# Patient Record
Sex: Female | Born: 1990 | Race: Black or African American | Hispanic: No | Marital: Single | Smoking: Never smoker
Health system: Southern US, Community
[De-identification: ages and names within clinical notes are randomized; demographics above are authoritative.]

## PROBLEM LIST (undated history)

## (undated) DIAGNOSIS — D649 Anemia, unspecified: Secondary | ICD-10-CM

## (undated) DIAGNOSIS — B977 Papillomavirus as the cause of diseases classified elsewhere: Secondary | ICD-10-CM

## (undated) DIAGNOSIS — D069 Carcinoma in situ of cervix, unspecified: Secondary | ICD-10-CM

## (undated) DIAGNOSIS — I10 Essential (primary) hypertension: Secondary | ICD-10-CM

## (undated) HISTORY — DX: Papillomavirus as the cause of diseases classified elsewhere: B97.7

## (undated) HISTORY — PX: NO PAST SURGERIES: SHX2092

---
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
## 2014-06-01 ENCOUNTER — Encounter (HOSPITAL_COMMUNITY): Payer: Self-pay | Admitting: Emergency Medicine

## 2014-06-01 ENCOUNTER — Emergency Department (HOSPITAL_COMMUNITY)
Admission: EM | Admit: 2014-06-01 | Discharge: 2014-06-01 | Disposition: A | Discharge status: Home / Self Care | Payer: Medicaid Other | Source: Home / Self Care | Attending: Family Medicine | Admitting: Family Medicine

## 2014-06-01 DIAGNOSIS — J302 Other seasonal allergic rhinitis: Secondary | ICD-10-CM | POA: Diagnosis not present

## 2014-06-01 MED ORDER — FLUTICASONE PROPIONATE 50 MCG/ACT NA SUSP: 1.0000 | Freq: Two times a day (BID) | NASAL | Status: DC

## 2014-06-01 MED ORDER — METHYLPREDNISOLONE ACETATE 80 MG/ML IJ SUSP
80.0000 mg | Freq: Once | INTRAMUSCULAR | Status: AC
  Administered 2014-06-01: 80 mg via INTRAMUSCULAR

## 2014-06-01 MED ORDER — METHYLPREDNISOLONE ACETATE 80 MG/ML IJ SUSP
INTRAMUSCULAR | Status: AC
  Filled 2014-06-01: qty 1

## 2014-06-01 MED ORDER — CETIRIZINE HCL 10 MG PO TABS: 10.0000 mg | ORAL_TABLET | Freq: Every day | ORAL | Status: DC

## 2014-06-01 NOTE — ED Provider Notes (Signed)
CSN: [id number]     Arrival date & time 06/01/14  1615 History   First MD Initiated Contact with Patient 06/01/14 1750     Chief Complaint  Patient presents with  . Cough   (Consider location/radiation/quality/duration/timing/severity/associated sxs/prior Treatment) Patient is a 24 y.o. female presenting with cough.  Cough Cough characteristics:  Non-productive, dry and harsh Severity:  Moderate Onset quality:  Gradual Duration:  2 weeks Progression:  Unchanged Chronicity:  New Smoker: no   Context: exposure to allergens, upper respiratory infection and weather changes   Relieved by:  None tried Worsened by:  Nothing tried Ineffective treatments:  None tried Associated symptoms: rhinorrhea and sinus congestion   Associated symptoms: no chills, no fever, no rash, no sore throat and no wheezing     History reviewed. No pertinent past medical history. History reviewed. No pertinent past surgical history. History reviewed. No pertinent family history. History  Substance Use Topics  . Smoking status: Never Smoker   . Smokeless tobacco: Never Used  . Alcohol Use: Yes     Comment: occasional   OB History    No data available     Review of Systems  Constitutional: Negative for fever and chills.  HENT: Positive for congestion, postnasal drip and rhinorrhea. Negative for sore throat.   Respiratory: Positive for cough. Negative for wheezing.   Skin: Negative for rash.    Allergies  Review of patient's allergies indicates not on file.  Home Medications   Prior to Admission medications   Medication Sig Start Date End Date Taking? Authorizing Provider  cetirizine (ZYRTEC) 10 MG tablet Take 1 tablet (10 mg total) by mouth daily. One tab daily for allergies 06/01/14   Linna HoffJames D Kindl, MD  fluticasone [facility](FLONASE) 50 MCG/ACT nasal spray Place 1 spray into both nostrils 2 (two) times daily. 06/01/14   Linna HoffJames D Kindl, MD   Pulse 101  Temp(Src) 98.2 F (36.8 C) (Oral)  Resp 20  SpO2 100%   LMP 05/19/2014 (Exact Date) Physical Exam  Constitutional: She is oriented to person, place, and time. She appears well-developed and well-nourished.  HENT:  Right Ear: External ear normal.  Left Ear: External ear normal.  Mouth/Throat: Oropharynx is clear and moist.  Neck: Normal range of motion. Neck supple.  Cardiovascular: Regular rhythm and normal heart sounds.   Pulmonary/Chest: Effort normal and breath sounds normal.  Lymphadenopathy:    She has no cervical adenopathy.  Neurological: She is alert and oriented to person, place, and time.  Skin: Skin is warm and dry.  Nursing note and vitals reviewed.   ED Course  Procedures (including critical care time) Labs Review Labs Reviewed - No data to display  Imaging Review No results found.   MDM   1. Seasonal allergic rhinitis        Linna HoffJames D Kindl, MD 06/03/14 1428

## 2014-06-01 NOTE — ED Notes (Signed)
Pt has been suffering from a cough for 1 week.  Her son's pediatrician suggested she be seen.

## 2016-02-27 NOTE — L&D Delivery Note (Signed)
26 y.o. G2P1001 at 106w1d delivered a viable female infant in cephalic, LOA position. no nuchal cord, anterior shoulder delivered with ease. 60 sec delayed cord clamping. Cord clamped x2 and cut. Placenta delivered spontaneously intact, with 3VC. Fundus firm on exam with massage and pitocin. Good hemostasis noted.  Laceration: 1st degree perineal  Suture: none Good hemostasis noted.  Mom and baby recovering in LDR.    Apgars: 8/9 Weight: pending   Renne Musca, MD PGY-2 11/29/2016, 12:25 AM  Patient is a G2P1001 at [redacted]w[redacted]d who was admitted with IOL for cHTN, significant hx of morbid obesity but otherwise uncomplicated prenatal course.  She progressed with augmentation via foley, cytotec, Pit/AROM.  I was gloved and present for delivery in its entirety.  Second stage of labor progressed, baby delivered after a couple contractions.  no decels during second stage noted.  Complications: none  Lacerations: none  EBL: 100cc  SHAW, KIMBERLY, CNM 1:44 AM 11/29/2016

## 2016-03-29 ENCOUNTER — Ambulatory Visit (INDEPENDENT_AMBULATORY_CARE_PROVIDER_SITE_OTHER): Payer: Medicaid Other | Admitting: General Practice

## 2016-03-29 ENCOUNTER — Encounter: Payer: Self-pay | Admitting: General Practice

## 2016-03-29 ENCOUNTER — Inpatient Hospital Stay (HOSPITAL_COMMUNITY)
Admission: AD | Admit: 2016-03-29 | Discharge: 2016-03-29 | Disposition: A | Discharge status: Home / Self Care | Payer: Medicaid Other | Source: Ambulatory Visit | Attending: Obstetrics and Gynecology | Admitting: Obstetrics and Gynecology

## 2016-03-29 DIAGNOSIS — N926 Irregular menstruation, unspecified: Secondary | ICD-10-CM | POA: Diagnosis not present

## 2016-03-29 DIAGNOSIS — Z3201 Encounter for pregnancy test, result positive: Secondary | ICD-10-CM

## 2016-03-29 DIAGNOSIS — N912 Amenorrhea, unspecified: Secondary | ICD-10-CM | POA: Diagnosis present

## 2016-03-29 LAB — POCT PREGNANCY, URINE: Preg Test, Ur: POSITIVE — AB

## 2016-03-29 NOTE — MAU Note (Signed)
Pt just wants confirmation of pregnancy. Denies nay pain bleeding or cramping. Had positive HPT.

## 2016-03-29 NOTE — Progress Notes (Signed)
Patient here for pregnancy test today. UPT +. Patient reports first positive home test 03/26/16. LMP 02/29/16 EDD 12/05/16 2238w1d. Informed patient. Patient states she is not taking any medicines or vitamins. Encouraged patient to begin PNV and start care in 5-6 weeks. Pregnancy verification letter given. Patient had no questions

## 2016-03-29 NOTE — MAU Provider Note (Signed)
Ms.Kingston Dorris Carnes Broadus JohnWarren is a 26 y.o. No obstetric history on file. at Unknown who presents to MAU/Clinic today for pregnancy verification. The patient denies abdominal pain or vaginal bleeding today.   BP (!) 113/48   Pulse 80   Temp 98.9 F (37.2 C)   Resp 18   Wt (!) 329 lb (149.2 kg)   LMP 02/29/2016   CONSTITUTIONAL: Well-developed, well-nourished female in no acute distress.  CARDIOVASCULAR: Regular heart rate RESPIRATORY: Normal effort NEUROLOGICAL: Alert and oriented to person, place, and time.  SKIN: Skin is warm and dry. No rash noted. Not diaphoretic. No erythema. No pallor. PSYCH: Normal mood and affect. Normal behavior. Normal judgment and thought content.  MDM Medical Screen Exam Complete  A: Amenorrhea   P: Discharge from MAU Patient advised to follow-up with [facility] for pregnancy confirmation  Monday-Thursday 8am-4pm or Friday 8am-11am Patient may return to MAU as needed or if her condition were to change or worsen   Aviva SignsMarie L Addilynn Mowrer, CNM  03/29/2016 12:45 PM

## 2016-05-13 ENCOUNTER — Encounter (HOSPITAL_COMMUNITY): Payer: Self-pay | Admitting: Emergency Medicine

## 2016-05-13 ENCOUNTER — Ambulatory Visit (HOSPITAL_COMMUNITY)
Admission: EM | Admit: 2016-05-13 | Discharge: 2016-05-13 | Disposition: A | Discharge status: Home / Self Care | Payer: Medicaid Other

## 2016-05-13 DIAGNOSIS — L03114 Cellulitis of left upper limb: Secondary | ICD-10-CM | POA: Diagnosis not present

## 2016-05-13 MED ORDER — CEPHALEXIN 500 MG PO CAPS: 500.0000 mg | ORAL_CAPSULE | Freq: Four times a day (QID) | ORAL | 0 refills | Status: AC

## 2016-05-13 NOTE — Discharge Instructions (Signed)
[facility] worsens and is outside drawn lines or does not improve in 2 days.

## 2016-05-13 NOTE — ED Provider Notes (Signed)
CSN: [id number]     Arrival date & time 05/13/16  1312 History   None    Chief Complaint  Patient presents with  . Extremity Pain   (Consider location/radiation/quality/duration/timing/severity/associated sxs/prior Treatment) 26 y.o. female presents withrash with edema and erythremia to the medial aspect of her left arm  X  1 week. Patient denies any injury or trauma to eht affected area. Evaluation is limited due to the patient's body habitus and limitation of patient to move her affected extremity Condition is acute in nature. Area is not circumventional. Condition is made better by nothing. Condition is made worse by nothing. Patient denies any tratement prior to there arrival at this facility. Patient is approximately [redacted] weeks pregnant.        History reviewed. No pertinent past medical history. History reviewed. No pertinent surgical history. History reviewed. No pertinent family history. Social History  Substance Use Topics  . Smoking status: Never Smoker  . Smokeless tobacco: Never Used  . Alcohol use Yes     Comment: occasional   OB History    Gravida Para Term Preterm AB Living   1             SAB TAB Ectopic Multiple Live Births                 Review of Systems  Constitutional: Negative for chills and fever.  HENT: Negative for ear pain and sore throat.   Eyes: Negative for pain and visual disturbance.  Respiratory: Negative for cough and shortness of breath.   Cardiovascular: Negative for chest pain and palpitations.  Gastrointestinal: Negative for abdominal pain and vomiting.  Genitourinary: Negative for dysuria and hematuria.  Musculoskeletal: Negative for arthralgias and back pain.  Skin: Positive for rash. Negative for color change.  Neurological: Negative for seizures and syncope.  All other systems reviewed and are negative.   Allergies  Patient has no known allergies.  Home Medications   Prior to Admission medications   Medication Sig Start Date  End Date Taking? Authorizing Provider  Prenatal MV-Min-Fe Fum-FA-DHA (PRENATAL 1 PO) Take by mouth.   Yes Historical Provider, MD  cephALEXin (KEFLEX) 500 MG capsule Take 1 capsule (500 mg total) by mouth 4 (four) times daily. 05/13/16 05/23/16  Alene MiresJennifer C Leveta Wahab, NP   Meds Ordered and Administered this Visit  Medications - No data to display  BP 114/81 (BP Location: Right Wrist)   Pulse (!) 130   Temp 98.8 F (37.1 C) (Oral)   Resp 20   LMP 02/29/2016   SpO2 100%  No data found.   Physical Exam  Constitutional: She is oriented to person, place, and time. She appears well-developed and well-nourished.  HENT:  Head: Normocephalic and atraumatic.  Eyes: Conjunctivae are normal.  Neck: Normal range of motion.  Pulmonary/Chest: Effort normal.  Neurological: She is alert and oriented to person, place, and time.  Skin: Skin is warm. There is erythema ( and edema to medial aspect of left upper arm. ).  Psychiatric: She has a normal mood and affect.  Nursing note and vitals reviewed.   Urgent Care Course     Procedures (including critical care time)  Labs Review Labs Reviewed - No data to display  Imaging Review No results found.   Skin site marked for determination of worsening condition  MDM   1. Cellulitis of left upper arm        Alene MiresJennifer C Gustavo Dispenza, NP 05/13/16 1426

## 2016-05-13 NOTE — ED Triage Notes (Signed)
[facility] a [facility] upper left arm pain x 1 week. The patient denied any known injury.

## 2016-05-30 ENCOUNTER — Encounter: Payer: Medicaid Other | Admitting: Obstetrics & Gynecology

## 2016-06-04 ENCOUNTER — Other Ambulatory Visit (HOSPITAL_COMMUNITY)
Admission: RE | Admit: 2016-06-04 | Discharge: 2016-06-04 | Disposition: A | Discharge status: Home / Self Care | Payer: Medicaid Other | Source: Ambulatory Visit | Attending: Certified Nurse Midwife | Admitting: Certified Nurse Midwife

## 2016-06-04 ENCOUNTER — Encounter: Payer: Self-pay | Admitting: Certified Nurse Midwife

## 2016-06-04 ENCOUNTER — Telehealth: Payer: Self-pay | Admitting: *Deleted

## 2016-06-04 ENCOUNTER — Ambulatory Visit (INDEPENDENT_AMBULATORY_CARE_PROVIDER_SITE_OTHER): Payer: Medicaid Other | Admitting: Certified Nurse Midwife

## 2016-06-04 VITALS — BP 160/83 | HR 96 | Ht 69.0 in | Wt 315.4 lb

## 2016-06-04 DIAGNOSIS — Z3481 Encounter for supervision of other normal pregnancy, first trimester: Secondary | ICD-10-CM | POA: Diagnosis not present

## 2016-06-04 DIAGNOSIS — O10919 Unspecified pre-existing hypertension complicating pregnancy, unspecified trimester: Secondary | ICD-10-CM

## 2016-06-04 DIAGNOSIS — O0990 Supervision of high risk pregnancy, unspecified, unspecified trimester: Secondary | ICD-10-CM

## 2016-06-04 DIAGNOSIS — O99210 Obesity complicating pregnancy, unspecified trimester: Secondary | ICD-10-CM

## 2016-06-04 DIAGNOSIS — O10911 Unspecified pre-existing hypertension complicating pregnancy, first trimester: Secondary | ICD-10-CM

## 2016-06-04 DIAGNOSIS — O99211 Obesity complicating pregnancy, first trimester: Secondary | ICD-10-CM | POA: Diagnosis not present

## 2016-06-04 DIAGNOSIS — O26892 Other specified pregnancy related conditions, second trimester: Secondary | ICD-10-CM

## 2016-06-04 DIAGNOSIS — R51 Headache: Secondary | ICD-10-CM

## 2016-06-04 DIAGNOSIS — O219 Vomiting of pregnancy, unspecified: Secondary | ICD-10-CM

## 2016-06-04 MED ORDER — LABETALOL HCL 200 MG PO TABS: 200.0000 mg | ORAL_TABLET | Freq: Two times a day (BID) | ORAL | 6 refills | Status: DC

## 2016-06-04 MED ORDER — DOXYLAMINE-PYRIDOXINE 10-10 MG PO TBEC: DELAYED_RELEASE_TABLET | ORAL | 4 refills | Status: DC

## 2016-06-04 MED ORDER — ASPIRIN 81 MG PO CHEW: 81.0000 mg | CHEWABLE_TABLET | Freq: Every day | ORAL | 12 refills | Status: DC

## 2016-06-04 MED ORDER — BUTALBITAL-APAP-CAFFEINE 50-325-40 MG PO TABS: 1.0000 | ORAL_TABLET | Freq: Four times a day (QID) | ORAL | 4 refills | Status: DC | PRN

## 2016-06-04 MED ORDER — PRENATE PIXIE 10-0.6-0.4-200 MG PO CAPS: 1.0000 | ORAL_CAPSULE | Freq: Every day | ORAL | 12 refills | Status: DC

## 2016-06-04 NOTE — Progress Notes (Signed)
Subjective:    Carly Hopkins is being seen today for her first obstetrical visit.  This is a planned pregnancy. She is at [redacted]w[redacted]d gestation. Her obstetrical history is significant for obesity and hypertension. Relationship with FOB: significant other, living together. Patient does not intend to breast feed. Pregnancy history fully reviewed.  The information documented in the HPI was reviewed and verified.  Menstrual History: OB History    Gravida Para Term Preterm AB Living   SAB TAB Ectopic Multiple Live Births           1       Patient's last menstrual period was 02/29/2016 (exact date).    History reviewed. No pertinent past medical history.  History reviewed. No pertinent surgical history.   (Not in a hospital admission) No Known Allergies  Social History  Substance Use Topics  . Smoking status: Never Smoker  . Smokeless tobacco: Never Used  . Alcohol use No     Comment: occasional    Family History  Problem Relation Age of Onset  . Hypertension Mother   . Diabetes Mother   . Diabetes Maternal Grandmother   . Hypertension Maternal Grandmother   . Hypertension Maternal Grandfather   . Diabetes Maternal Grandfather      Review of Systems Constitutional: negative for weight loss Gastrointestinal: negative for vomiting, + nausea Genitourinary:negative for genital lesions and vaginal discharge and dysuria Musculoskeletal:negative for back pain Behavioral/Psych: negative for abusive relationship, depression, illegal drug usage and tobacco use    Objective:    BP (!) 160/83   Pulse 96   Ht  (1.753 m)   Wt (!) 315 lb 6.4 oz (143.1 kg)   LMP 02/29/2016 (Exact Date)   BMI 46.58 kg/m  General Appearance:    Alert, cooperative, no distress, appears stated age  Head:    Normocephalic, without obvious abnormality, atraumatic  Eyes:    PERRL, conjunctiva/corneas clear, EOM's intact, fundi    benign, both eyes  Ears:    Normal TM's and external ear  canals, both ears  Nose:   Nares normal, septum midline, mucosa normal, no drainage    or sinus tenderness  Throat:   Lips, mucosa, and tongue normal; teeth and gums normal  Neck:   Supple, symmetrical, trachea midline, no adenopathy;    thyroid:  no enlargement/tenderness/nodules; no carotid   bruit or JVD  Back:     Symmetric, no curvature, ROM normal, no CVA tenderness  Lungs:     Clear to auscultation bilaterally, respirations unlabored  Chest Wall:    No tenderness or deformity   Heart:    Regular rate and rhythm, S1 and S2 normal, no murmur, rub   or gallop  Breast Exam:    No tenderness, masses, or nipple abnormality  Abdomen:     Soft, non-tender, bowel sounds active all four quadrants,    no masses, no organomegaly  Genitalia:    Normal female without lesion, discharge or tenderness  Extremities:   Extremities normal, atraumatic, no cyanosis or edema  Pulses:   2+ and symmetric all extremities  Skin:   Skin color, texture, turgor normal, no rashes or lesions  Lymph nodes:   Cervical, supraclavicular, and axillary nodes normal  Neurologic:   CNII-XII intact, normal strength, sensation and reflexes    throughout        Cervix: friable on exam, long, thick, closed and posterior.  FHR: 155 by doppler.  Size c/w dates.      Lab Review Urine pregnancy test Labs reviewed yes Radiologic studies reviewed no Assessment:    Pregnancy at [redacted]w[redacted]d weeks   Supervision of high risk pregnancy, antepartum - Plan: Cytology - PAP, Cervicovaginal ancillary only, Hemoglobinopathy evaluation, Varicella zoster antibody, IgG, Culture, OB Urine, Hemoglobin A1c, Obstetric Panel, Including HIV, Cystic Fibrosis Mutation 97, MaterniT Genome, TSH, Lactate dehydrogenase, Creatinine, serum, ALT, AST, Pathologist smear review, Protein / creatinine ratio, urine, AMB referral to maternal fetal medicine, [place] MFM OB DETAIL +14 WK, Prenat-FeAsp-Meth-FA-DHA w/o A (PRENATE PIXIE) 10-0.6-0.4-200 MG CAPS  Maternal  morbid obesity, antepartum (HCC)  Chronic hypertension affecting pregnancy - Plan: labetalol (NORMODYNE) 200 MG tablet, aspirin 81 MG chewable tablet  Nausea and vomiting during pregnancy prior to [redacted] weeks gestation - Plan: Doxylamine-Pyridoxine (DICLEGIS) 10-10 MG TBEC  Headache in pregnancy, antepartum, second trimester - Plan: butalbital-acetaminophen-caffeine (FIORICET, ESGIC) 50-325-40 MG tablet   Plan:      Prenatal vitamins.  Counseling provided regarding continued use of seat belts, cessation of alcohol consumption, smoking or use of illicit drugs; infection precautions i.e., influenza/TDAP immunizations, toxoplasmosis,CMV, parvovirus, listeria and varicella; workplace safety, exercise during pregnancy; routine dental care, safe medications, sexual activity, hot tubs, saunas, pools, travel, caffeine use, fish and methlymercury, potential toxins, hair treatments, varicose veins Weight gain recommendations per IOM guidelines reviewed: underweight/BMI< 18.5--> gain 28 - 40 lbs; normal weight/BMI 18.5 - 24.9--> gain 25 - 35 lbs; overweight/BMI 25 - 29.9--> gain 15 - 25 lbs; obese/BMI >30->gain  11 - 20 lbs Problem list reviewed and updated. FIRST/CF mutation testing/NIPT/QUAD SCREEN/fragile X/Ashkenazi Jewish population testing/Spinal muscular atrophy discussed: ordered. Role of ultrasound in pregnancy discussed; fetal survey: ordered. Amniocentesis discussed: not indicated.  Meds ordered this encounter  Medications  . labetalol (NORMODYNE) 200 MG tablet    Sig: Take 1 tablet (200 mg total) by mouth 2 (two) times daily.    Dispense:  60 tablet    Refill:  6  . aspirin 81 MG chewable tablet    Sig: Chew 1 tablet (81 mg total) by mouth daily.    Dispense:  30 tablet    Refill:  12   Orders Placed This Encounter  Procedures  . Culture, OB Urine  . [place] MFM OB DETAIL +14 WK    Standing Status:   Future    Standing Expiration Date:   08/04/2017    Order Specific Question:   Reason  for Exam (SYMPTOM  OR DIAGNOSIS REQUIRED)    Answer:   fetal anatomy scan    Order Specific Question:   Preferred imaging location?    Answer:   [facility]  . Hemoglobinopathy evaluation  . Varicella zoster antibody, IgG  . Hemoglobin A1c  . Obstetric Panel, Including HIV  . Cystic Fibrosis Mutation 97  . MaterniT Genome    Order Specific Question:   Is the patient insulin dependent?    Answer:   No    Order Specific Question:   Please enter gestational age. This should be expressed as weeks AND days, i.e. 16w 6d. Enter weeks here. Enter days in next question.    Answer:   62    Order Specific Question:   Please enter gestational age. This should be expressed as weeks AND days, i.e. 16w 6d. Enter days here. Enter weeks in previous question.    Answer:   5    Order Specific Question:   How was gestational age calculated?    Answer:   LMP  Order Specific Question:   Please give the date of LMP OR Ultrasound OR Estimated date of delivery.    Answer:   12/05/2016    Order Specific Question:   Number of Fetuses (Type of Pregnancy):    Answer:   1    Order Specific Question:   Indications for performing the test? (please choose all that apply):    Answer:   Routine screening    Order Specific Question:   Other Indications? (Y=Yes, N=No)    Answer:   Y    Order Specific Question:   Please specify other indications, if any:    Answer:   chronic hypertension    Order Specific Question:   If this is a repeat specimen, please indicate the reason:    Answer:   Not indicated    Order Specific Question:   Please specify the patient's race: (C=White/Caucasion, B=Black, I=Native American, A=Asian, H=Hispanic, O=Other, U=Unknown)    Answer:   B    Order Specific Question:   Donor Egg - indicate if the egg was obtained from in vitro fertilization.    Answer:   N    Order Specific Question:   Age of Egg Donor.    Answer:   14    Order Specific Question:   Prior Down Syndrome/ONTD screening  during current pregnancy.    Answer:   N    Order Specific Question:   Prior First Trimester Testing    Answer:   N    Order Specific Question:   Prior Second Trimester Testing    Answer:   N    Order Specific Question:   Family History of Neural Tube Defects    Answer:   N    Order Specific Question:   Prior Pregnancy with Down Syndrome    Answer:   N    Order Specific Question:   Please give the patient's weight (in pounds)    Answer:   315  . TSH  . Lactate dehydrogenase  . Creatinine, serum  . ALT  . AST  . Pathologist smear review  . Protein / creatinine ratio, urine  . AMB referral to maternal fetal medicine    Referral Priority:   Routine    Referral Type:   Consultation    Referral Reason:   Specialty Services Required    Number of Visits Requested:   1    Follow up in 4 weeks. 50% of 40 min visit spent on counseling and coordination of care.

## 2016-06-04 NOTE — Telephone Encounter (Signed)
PA for Diclegis received from pharmacy. PA was submitted and approved. Pharmacy notified.

## 2016-06-04 NOTE — Progress Notes (Signed)
Patient reports no concerns today- she has been having headache for 2 days. No vision changes or dizziness

## 2016-06-05 LAB — PROTEIN / CREATININE RATIO, URINE
Creatinine, Urine: 153.3 mg/dL
Protein, Ur: 18.4 mg/dL
Protein/Creat Ratio: 120 mg/g creat (ref 0–200)

## 2016-06-05 LAB — CERVICOVAGINAL ANCILLARY ONLY
Bacterial vaginitis: POSITIVE — AB
CHLAMYDIA, DNA PROBE: NEGATIVE
Candida vaginitis: NEGATIVE
NEISSERIA GONORRHEA: NEGATIVE
TRICH (WINDOWPATH): NEGATIVE

## 2016-06-06 ENCOUNTER — Other Ambulatory Visit: Payer: Self-pay | Admitting: Certified Nurse Midwife

## 2016-06-06 DIAGNOSIS — B9689 Other specified bacterial agents as the cause of diseases classified elsewhere: Secondary | ICD-10-CM

## 2016-06-06 DIAGNOSIS — N760 Acute vaginitis: Principal | ICD-10-CM

## 2016-06-06 LAB — OBSTETRIC PANEL, INCLUDING HIV
Antibody Screen: NEGATIVE
Basophils Absolute: 0 10*3/uL (ref 0.0–0.2)
Basos: 0 %
EOS (ABSOLUTE): 0.1 10*3/uL (ref 0.0–0.4)
Eos: 1 %
HIV Screen 4th Generation wRfx: NONREACTIVE
Hematocrit: 31.3 % — ABNORMAL LOW (ref 34.0–46.6)
Hemoglobin: 10.3 g/dL — ABNORMAL LOW (ref 11.1–15.9)
Hepatitis B Surface Ag: NEGATIVE
IMMATURE GRANS (ABS): 0 10*3/uL (ref 0.0–0.1)
IMMATURE GRANULOCYTES: 0 %
LYMPHS: 28 %
Lymphocytes Absolute: 2.3 10*3/uL (ref 0.7–3.1)
MCH: 26.7 pg (ref 26.6–33.0)
MCHC: 32.9 g/dL (ref 31.5–35.7)
MCV: 81 fL (ref 79–97)
MONOS ABS: 0.5 10*3/uL (ref 0.1–0.9)
Monocytes: 6 %
NEUTROS PCT: 65 %
Neutrophils Absolute: 5.3 10*3/uL (ref 1.4–7.0)
PLATELETS: 294 10*3/uL (ref 150–379)
RBC: 3.86 x10E6/uL (ref 3.77–5.28)
RDW: 17.3 % — AB (ref 12.3–15.4)
RPR Ser Ql: NONREACTIVE
Rh Factor: POSITIVE
Rubella Antibodies, IGG: 1.18 index (ref >0.99)
WBC: 8.2 10*3/uL (ref 3.4–10.8)

## 2016-06-06 LAB — CREATININE, SERUM
CREATININE: 0.49 mg/dL — AB (ref 0.57–1.00)
GFR calc Af Amer: 157 mL/min/{1.73_m2} (ref >59)
GFR, EST NON AFRICAN AMERICAN: 136 mL/min/{1.73_m2} (ref >59)

## 2016-06-06 LAB — HEMOGLOBINOPATHY EVALUATION
HGB A: 98 % (ref 96.4–98.8)
HGB C: 0 %
HGB S: 0 %
HGB VARIANT: 0 %
Hemoglobin A2 Quantitation: 2 % (ref 1.8–3.2)
Hemoglobin F Quantitation: 0 % (ref 0.0–2.0)

## 2016-06-06 LAB — TSH: TSH: 1.84 u[IU]/mL (ref 0.450–4.500)

## 2016-06-06 LAB — AST: AST: 10 IU/L (ref 0–40)

## 2016-06-06 LAB — CULTURE, OB URINE

## 2016-06-06 LAB — LACTATE DEHYDROGENASE: LDH: 161 IU/L (ref 119–226)

## 2016-06-06 LAB — HEMOGLOBIN A1C
Est. average glucose Bld gHb Est-mCnc: 97 mg/dL
HEMOGLOBIN A1C: 5 % (ref 4.8–5.6)

## 2016-06-06 LAB — URINE CULTURE, OB REFLEX

## 2016-06-06 LAB — ALT: ALT: 10 IU/L (ref 0–32)

## 2016-06-06 LAB — VARICELLA ZOSTER ANTIBODY, IGG: Varicella zoster IgG: 2365 index (ref >165)

## 2016-06-06 MED ORDER — METRONIDAZOLE 0.75 % VA GEL: 1.0000 | Freq: Two times a day (BID) | VAGINAL | 0 refills | Status: DC

## 2016-06-07 ENCOUNTER — Other Ambulatory Visit: Payer: Self-pay | Admitting: Certified Nurse Midwife

## 2016-06-07 DIAGNOSIS — O99012 Anemia complicating pregnancy, second trimester: Secondary | ICD-10-CM

## 2016-06-07 DIAGNOSIS — B977 Papillomavirus as the cause of diseases classified elsewhere: Secondary | ICD-10-CM | POA: Insufficient documentation

## 2016-06-07 DIAGNOSIS — R87613 High grade squamous intraepithelial lesion on cytologic smear of cervix (HGSIL): Secondary | ICD-10-CM

## 2016-06-07 DIAGNOSIS — D069 Carcinoma in situ of cervix, unspecified: Secondary | ICD-10-CM | POA: Insufficient documentation

## 2016-06-07 HISTORY — DX: Papillomavirus as the cause of diseases classified elsewhere: B97.7

## 2016-06-07 LAB — PATHOLOGIST SMEAR REVIEW
BASOS: 0 %
Basophils Absolute: 0 10*3/uL (ref 0.0–0.2)
EOS (ABSOLUTE): 0.1 10*3/uL (ref 0.0–0.4)
Eos: 1 %
HEMOGLOBIN: 10 g/dL — AB (ref 11.1–15.9)
Hematocrit: 32.2 % — ABNORMAL LOW (ref 34.0–46.6)
IMMATURE GRANS (ABS): 0 10*3/uL (ref 0.0–0.1)
Immature Granulocytes: 0 %
LYMPHS: 28 %
Lymphocytes Absolute: 2.3 10*3/uL (ref 0.7–3.1)
MCH: 26.1 pg — ABNORMAL LOW (ref 26.6–33.0)
MCHC: 31.1 g/dL — AB (ref 31.5–35.7)
MCV: 84 fL (ref 79–97)
MONOCYTES: 6 %
Monocytes Absolute: 0.5 10*3/uL (ref 0.1–0.9)
NEUTROS ABS: 5.3 10*3/uL (ref 1.4–7.0)
Neutrophils: 65 %
PATH REV PLTS: NORMAL
PATH REV WBC: NORMAL
Platelets: 296 10*3/uL (ref 150–379)
RBC: 3.83 x10E6/uL (ref 3.77–5.28)
RDW: 17.1 % — AB (ref 12.3–15.4)
WBC: 8.1 10*3/uL (ref 3.4–10.8)

## 2016-06-07 LAB — CYTOLOGY - PAP: HPV: DETECTED — AB

## 2016-06-07 MED ORDER — CITRANATAL BLOOM 90-1 MG PO TABS: 1.0000 | ORAL_TABLET | Freq: Every day | ORAL | 12 refills | Status: DC

## 2016-06-08 LAB — CYSTIC FIBROSIS MUTATION 97: Interpretation: NOT DETECTED

## 2016-06-10 LAB — MATERNIT GENOME

## 2016-06-12 ENCOUNTER — Other Ambulatory Visit: Payer: Self-pay | Admitting: Certified Nurse Midwife

## 2016-06-12 DIAGNOSIS — O0990 Supervision of high risk pregnancy, unspecified, unspecified trimester: Secondary | ICD-10-CM

## 2016-07-02 ENCOUNTER — Other Ambulatory Visit (HOSPITAL_COMMUNITY)
Admission: RE | Admit: 2016-07-02 | Discharge: 2016-07-02 | Disposition: A | Discharge status: Home / Self Care | Payer: Medicaid Other | Source: Ambulatory Visit | Attending: Obstetrics and Gynecology | Admitting: Obstetrics and Gynecology

## 2016-07-02 ENCOUNTER — Ambulatory Visit (INDEPENDENT_AMBULATORY_CARE_PROVIDER_SITE_OTHER): Payer: Medicaid Other | Admitting: Obstetrics and Gynecology

## 2016-07-02 VITALS — BP 124/78 | HR 88 | Wt 311.0 lb

## 2016-07-02 DIAGNOSIS — N871 Moderate cervical dysplasia: Secondary | ICD-10-CM | POA: Insufficient documentation

## 2016-07-02 DIAGNOSIS — O99212 Obesity complicating pregnancy, second trimester: Secondary | ICD-10-CM | POA: Diagnosis not present

## 2016-07-02 DIAGNOSIS — R87613 High grade squamous intraepithelial lesion on cytologic smear of cervix (HGSIL): Secondary | ICD-10-CM

## 2016-07-02 DIAGNOSIS — Z331 Pregnant state, incidental: Secondary | ICD-10-CM

## 2016-07-02 DIAGNOSIS — O10912 Unspecified pre-existing hypertension complicating pregnancy, second trimester: Secondary | ICD-10-CM | POA: Diagnosis not present

## 2016-07-02 DIAGNOSIS — O0992 Supervision of high risk pregnancy, unspecified, second trimester: Secondary | ICD-10-CM

## 2016-07-02 DIAGNOSIS — O0990 Supervision of high risk pregnancy, unspecified, unspecified trimester: Secondary | ICD-10-CM

## 2016-07-02 DIAGNOSIS — O10919 Unspecified pre-existing hypertension complicating pregnancy, unspecified trimester: Secondary | ICD-10-CM | POA: Insufficient documentation

## 2016-07-02 DIAGNOSIS — O99210 Obesity complicating pregnancy, unspecified trimester: Secondary | ICD-10-CM

## 2016-07-02 NOTE — Progress Notes (Signed)
   PRENATAL VISIT NOTE  Subjective:  Carly Hopkins is a 26 y.o. G2P1001 at 7914w5d being seen today for ongoing prenatal care.  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  Patient reports no complaints.  Contractions: Not present. Vag. Bleeding: None.  Movement: Present. Denies leaking of fluid.   The following portions of the patient's history were reviewed and updated as appropriate: allergies, current medications, past family history, past medical history, past social history, past surgical history and problem list. Problem list updated.  Objective:   Vitals:   07/02/16 0836  BP: 124/78  Pulse: 88  Weight: (!) 311 lb (141.1 kg)    Fetal Status:     Movement: Present     General:  Alert, oriented and cooperative. Patient is in no acute distress.  Skin: Skin is warm and dry. No rash noted.   Cardiovascular: Normal heart rate noted  Respiratory: Normal respiratory effort, no problems with respiration noted  Abdomen: Soft, gravid, appropriate for gestational age. Pain/Pressure: Absent     Pelvic:  Cervical exam deferred        Extremities: Normal range of motion.  Edema: None  Mental Status: Normal mood and affect. Normal behavior. Normal judgment and thought content.   Assessment and Plan:  Pregnancy: G2P1001 at 5514w5d  1. Supervision of high risk pregnancy, antepartum Patient is doing well without complaints Follow up anatomy ultrasound scheduled on 5/16  2. 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  Placed in lithotomy position. Cervix viewed with speculum and colposcope after application  of acetic acid.   Colposcopy adequate?  No TZ not visualized Acetowhite lesions? Yes 5-7 o'clock Punctation? yes Mosaicism?  no Abnormal vasculature?  no Biopsies? Yes at 6 o'clock ECC? no  3. Maternal morbid obesity, antepartum (HCC)   4. Chronic hypertension during pregnancy, antepartum Continue ASA and labetalol 200 BID Discussed serial growth ultrasound and plan for IOL at 39 weeks  General obstetric precautions including but not limited to vaginal bleeding, contractions, leaking of fluid and fetal movement were reviewed in detail with the patient. Please refer to After Visit Summary for other counseling recommendations.  No Follow-up on file.   Sokha Craker, Gigi GinPeggy, MD

## 2016-07-09 ENCOUNTER — Telehealth: Payer: Self-pay | Admitting: *Deleted

## 2016-07-09 NOTE — Telephone Encounter (Signed)
Attempt to contact pt regarding results and recommendations. LM on VM to call office.

## 2016-07-09 NOTE — Telephone Encounter (Signed)
-----   Message from Catalina AntiguaPeggy Constant, MD sent at 07/05/2016  1:44 PM EDT ----- Please inform patient of abnormal colpo and need for LEEP procedure postpartum. We can discuss these results with her at her next ob appointment  Thanks  [facility]

## 2016-07-11 ENCOUNTER — Other Ambulatory Visit (HOSPITAL_COMMUNITY): Payer: Self-pay | Admitting: *Deleted

## 2016-07-11 ENCOUNTER — Encounter (HOSPITAL_COMMUNITY): Payer: Self-pay

## 2016-07-11 ENCOUNTER — Ambulatory Visit (HOSPITAL_COMMUNITY)
Admission: RE | Admit: 2016-07-11 | Discharge: 2016-07-11 | Disposition: A | Discharge status: Home / Self Care | Payer: Medicaid Other | Source: Ambulatory Visit | Attending: Certified Nurse Midwife | Admitting: Certified Nurse Midwife

## 2016-07-11 DIAGNOSIS — Z3A19 19 weeks gestation of pregnancy: Secondary | ICD-10-CM | POA: Diagnosis not present

## 2016-07-11 DIAGNOSIS — O10919 Unspecified pre-existing hypertension complicating pregnancy, unspecified trimester: Secondary | ICD-10-CM

## 2016-07-11 DIAGNOSIS — O99212 Obesity complicating pregnancy, second trimester: Secondary | ICD-10-CM | POA: Insufficient documentation

## 2016-07-11 DIAGNOSIS — O10012 Pre-existing essential hypertension complicating pregnancy, second trimester: Secondary | ICD-10-CM | POA: Diagnosis not present

## 2016-07-11 DIAGNOSIS — Z6841 Body Mass Index (BMI) 40.0 and over, adult: Secondary | ICD-10-CM | POA: Diagnosis not present

## 2016-07-11 DIAGNOSIS — Z363 Encounter for antenatal screening for malformations: Secondary | ICD-10-CM | POA: Insufficient documentation

## 2016-07-11 DIAGNOSIS — O0990 Supervision of high risk pregnancy, unspecified, unspecified trimester: Secondary | ICD-10-CM | POA: Insufficient documentation

## 2016-07-11 HISTORY — DX: Essential (primary) hypertension: I10

## 2016-07-11 NOTE — Addendum Note (Signed)
Encounter addended by: Genevie CheshireWaken, Kelsei Defino M, RT on: 07/11/2016 10:49 AM<BR>    Actions taken: Imaging Exam ended

## 2016-07-12 ENCOUNTER — Ambulatory Visit (HOSPITAL_COMMUNITY)
Admission: RE | Admit: 2016-07-12 | Discharge: 2016-07-12 | Disposition: A | Discharge status: Home / Self Care | Payer: Medicaid Other | Source: Ambulatory Visit | Attending: Certified Nurse Midwife | Admitting: Certified Nurse Midwife

## 2016-07-12 ENCOUNTER — Other Ambulatory Visit: Payer: Self-pay | Admitting: Certified Nurse Midwife

## 2016-07-12 ENCOUNTER — Encounter (HOSPITAL_COMMUNITY): Payer: Self-pay

## 2016-07-12 DIAGNOSIS — E669 Obesity, unspecified: Secondary | ICD-10-CM | POA: Diagnosis not present

## 2016-07-12 DIAGNOSIS — Z7982 Long term (current) use of aspirin: Secondary | ICD-10-CM | POA: Diagnosis not present

## 2016-07-12 DIAGNOSIS — O99212 Obesity complicating pregnancy, second trimester: Secondary | ICD-10-CM | POA: Insufficient documentation

## 2016-07-12 DIAGNOSIS — Z3A19 19 weeks gestation of pregnancy: Secondary | ICD-10-CM | POA: Insufficient documentation

## 2016-07-12 DIAGNOSIS — Z79899 Other long term (current) drug therapy: Secondary | ICD-10-CM | POA: Insufficient documentation

## 2016-07-12 DIAGNOSIS — O162 Unspecified maternal hypertension, second trimester: Secondary | ICD-10-CM | POA: Diagnosis present

## 2016-07-12 DIAGNOSIS — O0990 Supervision of high risk pregnancy, unspecified, unspecified trimester: Secondary | ICD-10-CM

## 2016-07-12 NOTE — Consult Note (Signed)
Maternal Fetal Medicine Consultation  Requesting Provider(s): Valentina Lucksachelle Denny, CNM  Reason for consultation: Chronic hypertension, Obesity  HPI: Carly Hopkins is a 26 yo G2P1001, EDD 12/05/2016 who is currently at 19w 1d seen for consultation due to maternal obesity and chronic hypertension.  The patient reports that she was first diagnosed with hypertension during this current pregnancy - had an intake BP of 158/108 and subsequently 160/83.  She was started on Labetalol 200 mg BID - blood pressures currently well-controlled on this regimen.  Her past OB history is remarkable for a previous term SVD without complications.  She is without complaints today.  OB History: OB History    Gravida Para Term Preterm AB Living   2 1 1     1    SAB TAB Ectopic Multiple Live Births           1      PMH:  Past Medical History:  Diagnosis Date  . Hypertension     PSH:  Past Surgical History:  Procedure Laterality Date  . NO PAST SURGERIES     Meds:  Current Outpatient Prescriptions on File Prior to Encounter  Medication Sig Dispense Refill  . aspirin 81 MG chewable tablet Chew 1 tablet (81 mg total) by mouth daily. 30 tablet 12  . butalbital-acetaminophen-caffeine (FIORICET, ESGIC) 50-325-40 MG tablet Take 1-2 tablets by mouth every 6 (six) hours as needed. 45 tablet 4  . labetalol (NORMODYNE) 200 MG tablet Take 1 tablet (200 mg total) by mouth 2 (two) times daily. 60 tablet 6  . Prenat-FeAsp-Meth-FA-DHA w/o A (PRENATE PIXIE) 10-0.6-0.4-200 MG CAPS Take 1 tablet by mouth daily. 30 capsule 12  . Prenatal MV-Min-Fe Fum-FA-DHA (PRENATAL 1 PO) Take by mouth.    . Prenatal-DSS-FeCb-FeGl-FA (CITRANATAL BLOOM) 90-1 MG TABS Take 1 tablet by mouth daily. 30 tablet 12  . Doxylamine-Pyridoxine (DICLEGIS) 10-10 MG TBEC Take 1 tablet with breakfast and lunch.  Take 2 tablets at bedtime. (Patient not taking: Reported on 07/11/2016) 100 tablet 4  . metroNIDAZOLE (METROGEL VAGINAL) 0.75 % vaginal gel Place 1  Applicatorful vaginally 2 (two) times daily. (Patient not taking: Reported on 07/11/2016) 70 g 0   No current facility-administered medications on file prior to encounter.    Allergies: No Known Allergies   FH:  Family History  Problem Relation Age of Onset  . Hypertension Mother   . Diabetes Mother   . Diabetes Maternal Grandmother   . Hypertension Maternal Grandmother   . Hypertension Maternal Grandfather   . Diabetes Maternal Grandfather     Soc:  Social History   Social History  . Marital status: Single    Spouse name: N/A  . Number of children: N/A  . Years of education: N/A   Occupational History  . Not on file.   Social History Main Topics  . Smoking status: Never Smoker  . Smokeless tobacco: Never Used  . Alcohol use No     Comment: occasional  . Drug use: No  . Sexual activity: Yes    Birth control/ protection: None   Other Topics Concern  . Not on file   Social History Narrative  . No narrative on file    Review of Systems: no vaginal bleeding or cramping/contractions, no LOF, no nausea/vomiting. All other systems reviewed and are negative.  PE:   Vitals:   07/12/16 0906  BP: 118/72  Pulse: 97  Wt: 316 lbs  GEN: well-appearing female   A/P: 1) Single IUP at 19w 1d  2) Maternal Obesity - patient has a strong family history of diabetes.  Had an initial HbA1C of 5.0.  May consider early 1-hr OGTT to screen for gestational diabetes.  3) Chronic hypertension - diagnosis made during the first trimester after having several elevated blood pressures (156/108, 160/83).  She is currently well-controlled on her current dose of Labetalol.  Additionally, the patient is currently taking baby aspirin for preeclampsia prevention.  Recommendations: 1) Continue Labetalol at current dose.  May titrate upwards if needed - would try to achieve a target blood pressure of 140/90 if possible. 2) Serial ultrasounds for growth every 4-6 weeks throughout the remainder of  pregnancy 3) If not yet completed, please get baseline preeclampsia labs to include a baseline 24-hr urine protein 4) Antenatal testing beginning at 32 weeks 5) Delivery at 38-39 weeks if otherwise stable   Thank you for the opportunity to be a part of the care of [employer]. Please contact our office if we can be of further assistance.   I spent approximately 30 minutes with this patient with over 50% of time spent in face-to-face counseling.  Alpha Gula, MD Maternal Fetal Medicine

## 2016-07-13 NOTE — Addendum Note (Signed)
Encounter addended by: Reita ChardPaschal, Phillipa Morden R, RN on: 07/13/2016 10:59 AM<BR>    Actions taken: Charge Capture section accepted

## 2016-07-31 ENCOUNTER — Ambulatory Visit (INDEPENDENT_AMBULATORY_CARE_PROVIDER_SITE_OTHER): Payer: Medicaid Other | Admitting: Obstetrics and Gynecology

## 2016-07-31 VITALS — BP 115/78 | HR 106 | Wt 313.8 lb

## 2016-07-31 DIAGNOSIS — O0990 Supervision of high risk pregnancy, unspecified, unspecified trimester: Secondary | ICD-10-CM

## 2016-07-31 DIAGNOSIS — R87613 High grade squamous intraepithelial lesion on cytologic smear of cervix (HGSIL): Secondary | ICD-10-CM

## 2016-07-31 DIAGNOSIS — O0992 Supervision of high risk pregnancy, unspecified, second trimester: Secondary | ICD-10-CM | POA: Diagnosis not present

## 2016-07-31 DIAGNOSIS — O10919 Unspecified pre-existing hypertension complicating pregnancy, unspecified trimester: Secondary | ICD-10-CM

## 2016-07-31 DIAGNOSIS — O10912 Unspecified pre-existing hypertension complicating pregnancy, second trimester: Secondary | ICD-10-CM

## 2016-07-31 NOTE — Progress Notes (Signed)
   PRENATAL VISIT NOTE  Subjective:  Carly Hopkins is a 26 y.o. G2P1001 at 3472w6d being seen today for ongoing prenatal care.  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  Patient reports no complaints.  Contractions: Not present. Vag. Bleeding: None.  Movement: Present. Denies leaking of fluid.   The following portions of the patient's history were reviewed and updated as appropriate: allergies, current medications, past family history, past medical history, past social history, past surgical history and problem list. Problem list updated.  Objective:   Vitals:   07/31/16 0824  BP: 115/78  Pulse: (!) 106  Weight: (!) 313 lb 12.8 oz (142.3 kg)    Fetal Status: Fetal Heart Rate (bpm): 143 Fundal Height: 23 cm Movement: Present     General:  Alert, oriented and cooperative. Patient is in no acute distress.  Skin: Skin is warm and dry. No rash noted.   Cardiovascular: Normal heart rate noted  Respiratory: Normal respiratory effort, no problems with respiration noted  Abdomen: Soft, gravid, appropriate for gestational age. Pain/Pressure: Absent     Pelvic:  Cervical exam deferred        Extremities: Normal range of motion.  Edema: None  Mental Status: Normal mood and affect. Normal behavior. Normal judgment and thought content.   Assessment and Plan:  Pregnancy: G2P1001 at 2272w6d  1. Supervision of high risk pregnancy, antepartum Patient is doing well without complaints followup anatomy scheduled on 6/13 Third trimester labs and glucola next visit  2. 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 Chronic  hypertension during pregnancy, antepartum Continue ASA and labetalol 200 mg BID Follow up growth and anatomy on 6/13 Fetal testing starting at 32 weeks  General obstetric precautions including but not limited to vaginal bleeding, contractions, leaking of fluid and fetal movement were reviewed in detail with the patient. Please refer to After Visit Summary for other counseling recommendations.  Return in about 4 weeks (around 08/28/2016) for ROB, 2 hr glucola next visit.   Catalina AntiguaPeggy Arlie Riker, MD

## 2016-07-31 NOTE — Patient Instructions (Signed)
Cervical Conization °Cervical conization (cone biopsy) is a procedure in which a cone-shaped portion of the cervix is cut out so that it can be examined under a microscope. The procedure is done to check for cancer cells or cells that might turn into cancer (precancerous cells). You may have this procedure if: °· You have abnormal bleeding from your cervix. °· You had an abnormal Pap test. °· Something abnormal was seen on your cervix during an exam. ° °This procedure is performed in either a health care provider’s office or in an operating room. °Tell a health care provider about: °· Any allergies you have. °· All medicines you are taking, including vitamins, herbs, eye drops, creams, and over-the-counter medicines. °· Any problems you or family members have had with the use of anesthetic medicines. °· Any blood disorders you have. °· Any surgeries you have had. °· Any medical conditions you have. °· Your smoking habits. °· When you normally have your period. °· Whether you are pregnant or may be pregnant. °What are the risks? °Generally, this is a safe procedure. However, problems may occur, including: °· Heavy bleeding for several days or weeks after the procedure. °· Allergic reactions to medicines or dyes. °· Increased risk of preterm labor in future pregnancies. °· Infection (rare). °· Damage to the cervix or other structures or organs (rare). ° °What happens before the procedure? °Staying hydrated °Follow instructions from your health care provider about hydration, which may include: °· Up to 2 hours before the procedure - you may continue to drink clear liquids, such as water, clear fruit juice, black coffee, and plain tea. ° °Eating and drinking restrictions °Follow instructions from your health care provider about eating and drinking, which may include: °· 8 hours before the procedure - stop eating heavy meals or foods such as meat, fried foods, or fatty foods. °· 6 hours before the procedure - stop eating  light meals or foods, such as toast or cereal. °· 6 hours before the procedure - stop drinking milk or drinks that contain milk. °· 2 hours before the procedure - stop drinking clear liquids. ° °General instructions °· Do not douche, have sex, use tampons, or use any vaginal medicines before the procedure as told by your health care provider. °· You may be asked to empty your bladder and bowel right before the procedure. °· Ask your health care provider about: °? Changing or stopping your normal medicines. This is important if you take diabetes medicines or blood thinners. °? Taking medicines such as aspirin and ibuprofen. These medicines can thin your blood. Do not take these medicines before your procedure if your doctor tells you not to. °· Plan to have someone take you home from the hospital or clinic. °What happens during the procedure? °· To reduce your risk of infection: °? Your health care team will wash or sanitize their hands. °? Your skin will be washed with soap. °? Hair may be removed from the surgical area. °· You will undress from the waist down and be given a gown to wear. °· You will lie on an examining table and put your feet in stirrups. °· An IV tube will be inserted into one of your veins. °· You will be given one or more of the following: °? A medicine to help you relax (sedative). °? A medicine to numb the area (local anesthetic). °? A medicine to make you fall asleep (general anesthetic). °? A medicine that numbs the cervix (cervical block). °·   A lubricated device called a speculum will be inserted into your vagina. It will be used to spread open the walls of the vagina so your health care provider can see the inside of the vagina and cervix better. °· An instrument that has a magnifying lens and a light (colposcope) will let your health care provider examine the cervix more closely. °· Your health care provider will apply a solution to your cervix. This turns abnormal areas a pale  color. °· A tissue sample will be removed from the cervix using one of the following methods: °? The cold knife method. In this method, the tissue is cut out with a knife (scalpel). °? The loop electrosurgical excision procedure (LEEP) method. In this method, the tissue is cut out with a thin wire that can burn (cauterize) the tissue with an electrical current. °? Laser treatment method. In this method, the tissue is cut out and then cauterized with a laser beam to prevent bleeding. °· Your health care provider will apply a paste over the biopsy areas to help control bleeding. °· The tissue sample will be examined under a microscope. °The procedure may vary among health care providers and hospitals. °What happens after the procedure? °· Your blood pressure, heart rate, breathing rate, and blood oxygen level will be monitored often until the medicines you were given have worn off. °· If you were given a local anesthetic, you will rest at the clinic or hospital until you are stable and feel ready to go home. °· If you were given a general anesthetic, you may be monitored for a longer period of time. °· You may have some cramping. °· You may have bloody discharge or light to moderate bleeding. °· You may have dark discharge coming from your vagina. This is from the paste used on the cervix to prevent bleeding. °Summary °· Cervical conization is a procedure in which a cone-shaped portion of the cervix is cut out so that it can be examined under a microscope. °· The procedure is done to check for cancer cells or cells that might turn into cancer (precancerous cells). °This information is not intended to replace advice given to you by your health care provider. Make sure you discuss any questions you have with your health care provider. °Document Released: 11/22/2004 Document Revised: 02/15/2016 Document Reviewed: 02/15/2016 °Elsevier Interactive Patient Education © 2017 [employer] ° °

## 2016-08-08 ENCOUNTER — Ambulatory Visit (HOSPITAL_COMMUNITY)
Admission: RE | Admit: 2016-08-08 | Discharge: 2016-08-08 | Disposition: A | Discharge status: Home / Self Care | Payer: Medicaid Other | Source: Ambulatory Visit | Attending: Certified Nurse Midwife | Admitting: Certified Nurse Midwife

## 2016-08-08 ENCOUNTER — Encounter (HOSPITAL_COMMUNITY): Payer: Self-pay

## 2016-08-08 ENCOUNTER — Other Ambulatory Visit (HOSPITAL_COMMUNITY): Payer: Self-pay | Admitting: Obstetrics and Gynecology

## 2016-08-08 ENCOUNTER — Other Ambulatory Visit (HOSPITAL_COMMUNITY): Payer: Self-pay | Admitting: *Deleted

## 2016-08-08 DIAGNOSIS — O09892 Supervision of other high risk pregnancies, second trimester: Secondary | ICD-10-CM | POA: Diagnosis not present

## 2016-08-08 DIAGNOSIS — Z3A23 23 weeks gestation of pregnancy: Secondary | ICD-10-CM

## 2016-08-08 DIAGNOSIS — O99212 Obesity complicating pregnancy, second trimester: Secondary | ICD-10-CM | POA: Insufficient documentation

## 2016-08-08 DIAGNOSIS — O10919 Unspecified pre-existing hypertension complicating pregnancy, unspecified trimester: Secondary | ICD-10-CM

## 2016-08-08 DIAGNOSIS — Z362 Encounter for other antenatal screening follow-up: Secondary | ICD-10-CM | POA: Insufficient documentation

## 2016-08-08 DIAGNOSIS — O10012 Pre-existing essential hypertension complicating pregnancy, second trimester: Secondary | ICD-10-CM | POA: Diagnosis present

## 2016-08-27 ENCOUNTER — Ambulatory Visit (INDEPENDENT_AMBULATORY_CARE_PROVIDER_SITE_OTHER): Payer: Medicaid Other | Admitting: Obstetrics and Gynecology

## 2016-08-27 VITALS — BP 100/67 | HR 97 | Wt 309.0 lb

## 2016-08-27 DIAGNOSIS — O0992 Supervision of high risk pregnancy, unspecified, second trimester: Secondary | ICD-10-CM

## 2016-08-27 DIAGNOSIS — O10912 Unspecified pre-existing hypertension complicating pregnancy, second trimester: Secondary | ICD-10-CM | POA: Diagnosis not present

## 2016-08-27 DIAGNOSIS — O0990 Supervision of high risk pregnancy, unspecified, unspecified trimester: Secondary | ICD-10-CM

## 2016-08-27 DIAGNOSIS — R87613 High grade squamous intraepithelial lesion on cytologic smear of cervix (HGSIL): Secondary | ICD-10-CM

## 2016-08-27 DIAGNOSIS — O99212 Obesity complicating pregnancy, second trimester: Secondary | ICD-10-CM

## 2016-08-27 DIAGNOSIS — O10919 Unspecified pre-existing hypertension complicating pregnancy, unspecified trimester: Secondary | ICD-10-CM

## 2016-08-27 DIAGNOSIS — O99210 Obesity complicating pregnancy, unspecified trimester: Secondary | ICD-10-CM

## 2016-08-27 MED ORDER — PRENATE PIXIE 10-0.6-0.4-200 MG PO CAPS: 1.0000 | ORAL_CAPSULE | Freq: Every day | ORAL | 12 refills | Status: DC

## 2016-08-27 MED ORDER — ASPIRIN 81 MG PO CHEW: 81.0000 mg | CHEWABLE_TABLET | Freq: Every day | ORAL | 12 refills | Status: DC

## 2016-08-27 MED ORDER — LABETALOL HCL 200 MG PO TABS: 200.0000 mg | ORAL_TABLET | Freq: Two times a day (BID) | ORAL | 6 refills | Status: DC

## 2016-08-27 NOTE — Progress Notes (Signed)
   PRENATAL VISIT NOTE  Subjective:  Carly Hopkins is a 26 y.o. G2P1001 at 421w5d being seen today for ongoing prenatal care.  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  Patient reports no complaints.  Contractions: Not present. Vag. Bleeding: None.  Movement: Present. Denies leaking of fluid.   The following portions of the patient's history were reviewed and updated as appropriate: allergies, current medications, past family history, past medical history, past social history, past surgical history and problem list. Problem list updated.  Objective:   Vitals:   08/27/16 0851  BP: 100/67  Pulse: 97  Weight: (!) 309 lb (140.2 kg)    Fetal Status: Fetal Heart Rate (bpm): 135 Fundal Height: 27 cm Movement: Present     General:  Alert, oriented and cooperative. Patient is in no acute distress.  Skin: Skin is warm and dry. No rash noted.   Cardiovascular: Normal heart rate noted  Respiratory: Normal respiratory effort, no problems with respiration noted  Abdomen: Soft, gravid, appropriate for gestational age. Pain/Pressure: Present     Pelvic:  Cervical exam deferred        Extremities: Normal range of motion.  Edema: None  Mental Status: Normal mood and affect. Normal behavior. Normal judgment and thought content.   Assessment and Plan:  Pregnancy: G2P1001 at 721w5d  1. Supervision of high risk pregnancy, antepartum Patient is doing well without complaints glucola next visit with labs - Prenat-FeAsp-Meth-FA-DHA w/o A (PRENATE PIXIE) 10-0.6-0.4-200 MG CAPS; Take 1 tablet by mouth daily.  Dispense: 30 capsule; Refill: 12  2. 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  Chronic hypertension during pregnancy, antepartum Continue labetalol and ASA Follow up growth ultrasound on 7/18  4. Maternal morbid obesity, antepartum (HCC)   5. Chronic hypertension affecting pregnancy - labetalol (NORMODYNE) 200 MG tablet; Take 1 tablet (200 mg total) by mouth 2 (two) times daily.  Dispense: 60 tablet; Refill: 6 - aspirin 81 MG chewable tablet; Chew 1 tablet (81 mg total) by mouth daily.  Dispense: 30 tablet; Refill: 12  Preterm labor symptoms and general obstetric precautions including but not limited to vaginal bleeding, contractions, leaking of fluid and fetal movement were reviewed in detail with the patient. Please refer to After Visit Summary for other counseling recommendations.  Return in about 2 weeks (around 09/10/2016) for ROB, 2 hr glucola next visit.   Carly AntiguaPeggy Danna Sewell, MD

## 2016-09-12 ENCOUNTER — Other Ambulatory Visit (HOSPITAL_COMMUNITY): Payer: Self-pay | Admitting: *Deleted

## 2016-09-12 ENCOUNTER — Other Ambulatory Visit (HOSPITAL_COMMUNITY): Payer: Self-pay | Admitting: Obstetrics and Gynecology

## 2016-09-12 ENCOUNTER — Ambulatory Visit (HOSPITAL_COMMUNITY)
Admission: RE | Admit: 2016-09-12 | Discharge: 2016-09-12 | Disposition: A | Discharge status: Home / Self Care | Payer: Medicaid Other | Source: Ambulatory Visit | Attending: Certified Nurse Midwife | Admitting: Certified Nurse Midwife

## 2016-09-12 ENCOUNTER — Encounter (HOSPITAL_COMMUNITY): Payer: Self-pay

## 2016-09-12 DIAGNOSIS — O99213 Obesity complicating pregnancy, third trimester: Secondary | ICD-10-CM | POA: Insufficient documentation

## 2016-09-12 DIAGNOSIS — Z3A28 28 weeks gestation of pregnancy: Secondary | ICD-10-CM

## 2016-09-12 DIAGNOSIS — O10013 Pre-existing essential hypertension complicating pregnancy, third trimester: Secondary | ICD-10-CM | POA: Insufficient documentation

## 2016-09-12 DIAGNOSIS — Z362 Encounter for other antenatal screening follow-up: Secondary | ICD-10-CM | POA: Diagnosis not present

## 2016-09-12 DIAGNOSIS — O10919 Unspecified pre-existing hypertension complicating pregnancy, unspecified trimester: Secondary | ICD-10-CM

## 2016-09-12 DIAGNOSIS — O10913 Unspecified pre-existing hypertension complicating pregnancy, third trimester: Secondary | ICD-10-CM

## 2016-09-13 ENCOUNTER — Ambulatory Visit (INDEPENDENT_AMBULATORY_CARE_PROVIDER_SITE_OTHER): Payer: Medicaid Other | Admitting: Obstetrics & Gynecology

## 2016-09-13 ENCOUNTER — Encounter: Payer: Self-pay | Admitting: Obstetrics & Gynecology

## 2016-09-13 ENCOUNTER — Other Ambulatory Visit: Payer: Medicaid Other

## 2016-09-13 VITALS — BP 136/80 | HR 99 | Wt 315.0 lb

## 2016-09-13 DIAGNOSIS — O0993 Supervision of high risk pregnancy, unspecified, third trimester: Secondary | ICD-10-CM

## 2016-09-13 DIAGNOSIS — O10919 Unspecified pre-existing hypertension complicating pregnancy, unspecified trimester: Secondary | ICD-10-CM

## 2016-09-13 DIAGNOSIS — O10913 Unspecified pre-existing hypertension complicating pregnancy, third trimester: Secondary | ICD-10-CM

## 2016-09-13 DIAGNOSIS — O99213 Obesity complicating pregnancy, third trimester: Secondary | ICD-10-CM

## 2016-09-13 DIAGNOSIS — O99210 Obesity complicating pregnancy, unspecified trimester: Secondary | ICD-10-CM

## 2016-09-13 DIAGNOSIS — O0990 Supervision of high risk pregnancy, unspecified, unspecified trimester: Secondary | ICD-10-CM

## 2016-09-13 NOTE — Progress Notes (Signed)
Pt states she did not take medications this morning due to glucose testing.

## 2016-09-13 NOTE — Progress Notes (Signed)
PRENATAL VISIT NOTE  Subjective:  Carly Hopkins is a 26 y.o. G2P1001 at [redacted]w[redacted]d being seen today for ongoing prenatal care.  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  Patient reports no complaints.  Contractions: Not present. Vag. Bleeding: None.  Movement: Present. Denies leaking of fluid.   The following portions of the patient's history were reviewed and updated as appropriate: allergies, current medications, past family history, past medical history, past social history, past surgical history and problem list. Problem list updated.  Objective:   Vitals:   09/13/16 0834  BP: 136/80  Pulse: 99  Weight: (!) 315 lb (142.9 kg)    Fetal Status: Fetal Heart Rate (bpm): 140   Movement: Present     General:  Alert, oriented and cooperative. Patient is in no acute distress.  Skin: Skin is warm and dry. No rash noted.   Cardiovascular: Normal heart rate noted  Respiratory: Normal respiratory effort, no problems with respiration noted  Abdomen: Soft, gravid, appropriate for gestational age.  Pain/Pressure: Present     Pelvic: Cervical exam deferred        Extremities: Normal range of motion.  Edema: None  Mental Status:  Normal mood and affect. Normal behavior. Normal judgment and thought content.   [place] Mfm Ob Follow Up  Result Date: 09/12/2016 ----------------------------------------------------------------------  OBSTETRICS REPORT                      (Signed Final 09/12/2016 02:08 pm) ---------------------------------------------------------------------- Patient Info  ID #:       [id number]                         D.O.B.:   09-01-1990 (26 yrs)  Name:       [name]               Visit Date:  09/12/2016 11:43 am  ---------------------------------------------------------------------- Performed By  Performed By:     Tommi Emery         Ref. Address:     [address]                                                             [place]  [address]  Attending:        Particia Nearing MD       Location:         [facility]  Referred By:      Roe Coombs CNM ---------------------------------------------------------------------- Orders   #  Description                                 Code   1  [place] MFM OB FOLLOW UP                         [phone]  ----------------------------------------------------------------------   #  Ordered By               Order #        Accession #    Episode #   1  MARK Ezzard Standing              [id number]      [id number]     [id number]  ---------------------------------------------------------------------- Indications   [redacted] weeks gestation of pregnancy                Z3A.28   Hypertension - Chronic/Pre-existing-labetalol  O10.019   and ASA   Maternal morbid obesity (BMI 46.5)             O99.210 E66.01   Encounter for other antenatal screening        Z36.2   follow-up  ---------------------------------------------------------------------- OB History  Blood Type:            Height:  5'9"   Weight (lb):  315      BMI:   46.51  Gravidity:    2         Term:   1  Living:       1 ---------------------------------------------------------------------- Fetal Evaluation  Num Of Fetuses:     1  Fetal Heart         145  Rate(bpm):  Cardiac Activity:   Observed  Presentation:       Cephalic  Placenta:           Posterior, above cervical os  P. Cord Insertion:  Previously Visualized  Amniotic Fluid  AFI FV:      Subjectively within normal limits  AFI Sum(cm)     %Tile       Largest Pocket(cm)  17.1            64          4.98  RUQ(cm)        RLQ(cm)       LUQ(cm)        LLQ(cm)  4.95          4.98          3.14           4.03 ---------------------------------------------------------------------- Biometry  BPD:      72.6  mm     G. Age:  29w 1d         75  %    CI:        81.93   %   70 - 86  FL/HC:      23.0   %   18.8 - 20.6  HC:      253.1  mm     G. Age:  27w 4d         10  %    HC/AC:      1.00       1.05 - 1.21  AC:      252.2  mm     G. Age:  29w 3d         82  %    FL/BPD:     80.2   %   71 - 87  FL:       58.2  mm     G. Age:  30w 3d         92  %    FL/AC:      23.1   %   20 - 24  HUM:      52.5  mm     G. Age:  30w 4d       > 95  %  CER:      35.6  mm     G. Age:  30w 4d         91  %  CM:        6.8  mm  Est. FW:    1408  gm      3 lb 2 oz     78  % ---------------------------------------------------------------------- Gestational Age  LMP:           28w 0d       Date:   02/29/16                 EDD:   12/05/16  U/S Today:     29w 1d                                        EDD:   11/27/16  Best:          28w 0d    Det. By:   LMP  (02/29/16)          EDD:   12/05/16 ---------------------------------------------------------------------- Anatomy  Cranium:               Appears normal         Aortic Arch:            Previously seen  Cavum:                 Appears normal         Ductal Arch:            Not well visualized  Ventricles:            Appears normal         Diaphragm:              Previously seen  Choroid Plexus:        Previously seen        Stomach:                Appears normal, left  sided  Cerebellum:            Appears normal         Abdomen:                Appears normal  Posterior Fossa:       Previously seen        Abdominal Wall:         Appears nml (cord                                                                        insert, abd wall)  Nuchal Fold:           Not applicable (>20    Cord Vessels:            Appears normal ([redacted]                         wks GA)                                        vessel cord)  Face:                  Appears normal         Kidneys:                Appear normal                         (orbits and profile)  Lips:                  Appears normal         Bladder:                Appears normal  Thoracic:              Appears normal         Spine:                  Appears normal  Heart:                 Appears normal         Upper Extremities:      Previously seen                         (4CH, axis, and                         situs)  RVOT:                  Appears normal         Lower Extremities:      Previously seen  LVOT:                  Previously seen  Other:  Technically difficult due to maternal habitus and fetal position. Fetus          appears to be a female previously seen. ---------------------------------------------------------------------- Cervix Uterus Adnexa  Cervix  Not  visualized  Uterus  No abnormality visualized.  Left Ovary  Not visualized. No adnexal mass visualized.  Right Ovary  Not visualized. No adnexal mass visualized. ---------------------------------------------------------------------- Impression  SIUP at 28+0 weeks  Normal interval anatomy; anatomic survey complete except  for the DA  Normal amniotic fluid volume  Appropriate interval growth with EFW at the 78th %tile ---------------------------------------------------------------------- Recommendations  Follow-up ultrasound for growth in 4 weeks ----------------------------------------------------------------------                 Particia Nearing, MD Electronically Signed Final Report   09/12/2016 02:08 pm ----------------------------------------------------------------------   Assessment and Plan:  Pregnancy: G2P1001 at [redacted]w[redacted]d  1. Chronic hypertension during pregnancy, antepartum Had growth scan yesterday, EFW 78%.  Normal BP on Labetalol and ASA.   Will start antenatal testing at 32 weeks; BPP  added on growth scan schedule at that time.  Preeclampsia precautions reviewed. - Comprehensive metabolic panel - Protein / creatinine ratio, urine - [place] MFM FETAL BPP WO NON STRESS; Future  2. Maternal morbid obesity, antepartum (HCC) No weight gain so far this pregnancy. Doing well with diet and exercise.  3. Supervision of high risk pregnancy, antepartum Third trimester labs and Tdap today - CBC - Glucose Tolerance, 2 Hours w/1 Hour - HIV antibody - RPR - Tdap vaccine greater than or equal to 7yo IM Preterm labor symptoms and general obstetric precautions including but not limited to vaginal bleeding, contractions, leaking of fluid and fetal movement were reviewed in detail with the patient. Please refer to After Visit Summary for other counseling recommendations.  Return in about 2 weeks (around 09/27/2016) for OB Visit.   Jaynie Collins, MD

## 2016-09-13 NOTE — Patient Instructions (Signed)
Third Trimester of Pregnancy The third trimester is from week 28 through week 40 (months 7 through 9). The third trimester is a time when the unborn baby (fetus) is growing rapidly. At the end of the ninth month, the fetus is about 20 inches in length and weighs 6-10 pounds. Body changes during your third trimester Your body will continue to go through many changes during pregnancy. The changes vary from woman to woman. During the third trimester:  Your weight will continue to increase. You can expect to gain 25-35 pounds (11-16 kg) by the end of the pregnancy.  You may begin to get stretch marks on your hips, abdomen, and breasts.  You may urinate more often because the fetus is moving lower into your pelvis and pressing on your bladder.  You may develop or continue to have heartburn. This is caused by increased hormones that slow down muscles in the digestive tract.  You may develop or continue to have constipation because increased hormones slow digestion and cause the muscles that push waste through your intestines to relax.  You may develop hemorrhoids. These are swollen veins (varicose veins) in the rectum that can itch or be painful.  You may develop swollen, bulging veins (varicose veins) in your legs.  You may have increased body aches in the pelvis, back, or thighs. This is due to weight gain and increased hormones that are relaxing your joints.  You may have changes in your hair. These can include thickening of your hair, rapid growth, and changes in texture. Some women also have hair loss during or after pregnancy, or hair that feels dry or thin. Your hair will most likely return to normal after your baby is born.  Your breasts will continue to grow and they will continue to become tender. A yellow fluid (colostrum) may leak from your breasts. This is the first milk you are producing for your baby.  Your belly button may stick out.  You may notice more swelling in your hands,  face, or ankles.  You may have increased tingling or numbness in your hands, arms, and legs. The skin on your belly may also feel numb.  You may feel short of breath because of your expanding uterus.  You may have more problems sleeping. This can be caused by the size of your belly, increased need to urinate, and an increase in your body's metabolism.  You may notice the fetus "dropping," or moving lower in your abdomen (lightening).  You may have increased vaginal discharge.  You may notice your joints feel loose and you may have pain around your pelvic bone.  What to expect at prenatal visits You will have prenatal exams every 2 weeks until week 36. Then you will have weekly prenatal exams. During a routine prenatal visit:  You will be weighed to make sure you and the baby are growing normally.  Your blood pressure will be taken.  Your abdomen will be measured to track your baby's growth.  The fetal heartbeat will be listened to.  Any test results from the previous visit will be discussed.  You may have a cervical check near your due date to see if your cervix has softened or thinned (effaced).  You will be tested for Group B streptococcus. This happens between 35 and 37 weeks.  Your health care provider may ask you:  What your birth plan is.  How you are feeling.  If you are feeling the baby move.  If you have had   any abnormal symptoms, such as leaking fluid, bleeding, severe headaches, or abdominal cramping.  If you are using any tobacco products, including cigarettes, chewing tobacco, and electronic cigarettes.  If you have any questions.  Other tests or screenings that may be performed during your third trimester include:  Blood tests that check for low iron levels (anemia).  Fetal testing to check the health, activity level, and growth of the fetus. Testing is done if you have certain medical conditions or if there are problems during the  pregnancy.  Nonstress test (NST). This test checks the health of your baby to make sure there are no signs of problems, such as the baby not getting enough oxygen. During this test, a belt is placed around your belly. The baby is made to move, and its heart rate is monitored during movement.  What is false labor? False labor is a condition in which you feel small, irregular tightenings of the muscles in the womb (contractions) that usually go away with rest, changing position, or drinking water. These are called Braxton Hicks contractions. Contractions may last for hours, days, or even weeks before true labor sets in. If contractions come at regular intervals, become more frequent, increase in intensity, or become painful, you should see your health care provider. What are the signs of labor?  Abdominal cramps.  Regular contractions that start at 10 minutes apart and become stronger and more frequent with time.  Contractions that start on the top of the uterus and spread down to the lower abdomen and back.  Increased pelvic pressure and dull back pain.  A watery or bloody mucus discharge that comes from the vagina.  Leaking of amniotic fluid. This is also known as your "water breaking." It could be a slow trickle or a gush. Let your health care provider know if it has a color or strange odor. If you have any of these signs, call your health care provider right away, even if it is before your due date. Follow these instructions at home: Medicines  Follow your health care provider's instructions regarding medicine use. Specific medicines may be either safe or unsafe to take during pregnancy.  Take a prenatal vitamin that contains at least 600 micrograms (mcg) of folic acid.  If you develop constipation, try taking a stool softener if your health care provider approves. Eating and drinking  Eat a balanced diet that includes fresh fruits and vegetables, whole grains, good sources of protein  such as meat, eggs, or tofu, and low-fat dairy. Your health care provider will help you determine the amount of weight gain that is right for you.  Avoid raw meat and uncooked cheese. These carry germs that can cause birth defects in the baby.  If you have low calcium intake from food, talk to your health care provider about whether you should take a daily calcium supplement.  Eat four or five small meals rather than three large meals a day.  Limit foods that are high in fat and processed sugars, such as fried and sweet foods.  To prevent constipation: ? Drink enough fluid to keep your urine clear or pale yellow. ? Eat foods that are high in fiber, such as fresh fruits and vegetables, whole grains, and beans. Activity  Exercise only as directed by your health care provider. Most women can continue their usual exercise routine during pregnancy. Try to exercise for 30 minutes at least 5 days a week. Stop exercising if you experience uterine contractions.  Avoid heavy   lifting.  Do not exercise in extreme heat or humidity, or at high altitudes.  Wear low-heel, comfortable shoes.  Practice good posture.  You may continue to have sex unless your health care provider tells you otherwise. Relieving pain and discomfort  Take frequent breaks and rest with your legs elevated if you have leg cramps or low back pain.  Take warm sitz baths to soothe any pain or discomfort caused by hemorrhoids. Use hemorrhoid cream if your health care provider approves.  Wear a good support bra to prevent discomfort from breast tenderness.  If you develop varicose veins: ? Wear support pantyhose or compression stockings as told by your healthcare provider. ? Elevate your feet for 15 minutes, 3-4 times a day. Prenatal care  Write down your questions. Take them to your prenatal visits.  Keep all your prenatal visits as told by your health care provider. This is important. Safety  Wear your seat belt at  all times when driving.  Make a list of emergency phone numbers, including numbers for family, friends, the hospital, and police and fire departments. General instructions  Avoid cat litter boxes and soil used by cats. These carry germs that can cause birth defects in the baby. If you have a cat, ask someone to clean the litter box for you.  Do not travel far distances unless it is absolutely necessary and only with the approval of your health care provider.  Do not use hot tubs, steam rooms, or saunas.  Do not drink alcohol.  Do not use any products that contain nicotine or tobacco, such as cigarettes and e-cigarettes. If you need help quitting, ask your health care provider.  Do not use any medicinal herbs or unprescribed drugs. These chemicals affect the formation and growth of the baby.  Do not douche or use tampons or scented sanitary pads.  Do not cross your legs for long periods of time.  To prepare for the arrival of your baby: ? Take prenatal classes to understand, practice, and ask questions about labor and delivery. ? Make a trial run to the hospital. ? Visit the hospital and tour the maternity area. ? Arrange for maternity or paternity leave through employers. ? Arrange for family and friends to take care of pets while you are in the hospital. ? Purchase a rear-facing car seat and make sure you know how to install it in your car. ? Pack your hospital bag. ? Prepare the baby's nursery. Make sure to remove all pillows and stuffed animals from the baby's crib to prevent suffocation.  Visit your dentist if you have not gone during your pregnancy. Use a soft toothbrush to brush your teeth and be gentle when you floss. Contact a health care provider if:  You are unsure if you are in labor or if your water has broken.  You become dizzy.  You have mild pelvic cramps, pelvic pressure, or nagging pain in your abdominal area.  You have lower back pain.  You have persistent  nausea, vomiting, or diarrhea.  You have an unusual or bad smelling vaginal discharge.  You have pain when you urinate. Get help right away if:  Your water breaks before 37 weeks.  You have regular contractions less than 5 minutes apart before 37 weeks.  You have a fever.  You are leaking fluid from your vagina.  You have spotting or bleeding from your vagina.  You have severe abdominal pain or cramping.  You have rapid weight loss or weight gain.    You have shortness of breath with chest pain.  You notice sudden or extreme swelling of your face, hands, ankles, feet, or legs.  Your baby makes fewer than 10 movements in 2 hours.  You have severe headaches that do not go away when you take medicine.  You have vision changes. Summary  The third trimester is from week 28 through week 40, months 7 through 9. The third trimester is a time when the unborn baby (fetus) is growing rapidly.  During the third trimester, your discomfort may increase as you and your baby continue to gain weight. You may have abdominal, leg, and back pain, sleeping problems, and an increased need to urinate.  During the third trimester your breasts will keep growing and they will continue to become tender. A yellow fluid (colostrum) may leak from your breasts. This is the first milk you are producing for your baby.  False labor is a condition in which you feel small, irregular tightenings of the muscles in the womb (contractions) that eventually go away. These are called Braxton Hicks contractions. Contractions may last for hours, days, or even weeks before true labor sets in.  Signs of labor can include: abdominal cramps; regular contractions that start at 10 minutes apart and become stronger and more frequent with time; watery or bloody mucus discharge that comes from the vagina; increased pelvic pressure and dull back pain; and leaking of amniotic fluid. This information is not intended to replace advice  given to you by your health care provider. Make sure you discuss any questions you have with your health care provider. Document Released: 02/06/2001 Document Revised: 07/21/2015 Document Reviewed: 04/15/2012 Elsevier Interactive Patient Education  2017 [employer]  

## 2016-09-14 LAB — COMPREHENSIVE METABOLIC PANEL
ALBUMIN: 3.1 g/dL — AB (ref 3.5–5.5)
ALK PHOS: 87 IU/L (ref 39–117)
ALT: 12 IU/L (ref 0–32)
AST: 17 IU/L (ref 0–40)
Albumin/Globulin Ratio: 0.9 — ABNORMAL LOW (ref 1.2–2.2)
BUN/Creatinine Ratio: 5 — ABNORMAL LOW (ref 9–23)
BUN: 3 mg/dL — AB (ref 6–20)
Bilirubin Total: 0.2 mg/dL (ref 0.0–1.2)
CALCIUM: 9.1 mg/dL (ref 8.7–10.2)
CO2: 20 mmol/L (ref 20–29)
CREATININE: 0.57 mg/dL (ref 0.57–1.00)
Chloride: 106 mmol/L (ref 96–106)
GFR calc Af Amer: 149 mL/min/{1.73_m2} (ref >59)
GFR, EST NON AFRICAN AMERICAN: 129 mL/min/{1.73_m2} (ref >59)
GLOBULIN, TOTAL: 3.3 g/dL (ref 1.5–4.5)
GLUCOSE: 69 mg/dL (ref 65–99)
Potassium: 4.1 mmol/L (ref 3.5–5.2)
Sodium: 140 mmol/L (ref 134–144)
Total Protein: 6.4 g/dL (ref 6.0–8.5)

## 2016-09-14 LAB — CBC
Hematocrit: 31.4 % — ABNORMAL LOW (ref 34.0–46.6)
Hemoglobin: 10.2 g/dL — ABNORMAL LOW (ref 11.1–15.9)
MCH: 27.9 pg (ref 26.6–33.0)
MCHC: 32.5 g/dL (ref 31.5–35.7)
MCV: 86 fL (ref 79–97)
PLATELETS: 343 10*3/uL (ref 150–379)
RBC: 3.66 x10E6/uL — ABNORMAL LOW (ref 3.77–5.28)
RDW: 14.3 % (ref 12.3–15.4)
WBC: 11.6 10*3/uL — AB (ref 3.4–10.8)

## 2016-09-14 LAB — HIV ANTIBODY (ROUTINE TESTING W REFLEX): HIV SCREEN 4TH GENERATION: NONREACTIVE

## 2016-09-14 LAB — RPR: RPR Ser Ql: NONREACTIVE

## 2016-09-14 LAB — GLUCOSE TOLERANCE, 2 HOURS W/ 1HR
GLUCOSE, 2 HOUR: 84 mg/dL (ref 65–152)
Glucose, 1 hour: 73 mg/dL (ref 65–179)
Glucose, Fasting: 77 mg/dL (ref 65–91)

## 2016-09-16 LAB — PROTEIN / CREATININE RATIO, URINE
Creatinine, Urine: 292.5 mg/dL
PROTEIN UR: 27.5 mg/dL
Protein/Creat Ratio: 94 mg/g creat (ref 0–200)

## 2016-09-27 ENCOUNTER — Ambulatory Visit (INDEPENDENT_AMBULATORY_CARE_PROVIDER_SITE_OTHER): Payer: Medicaid Other | Admitting: Obstetrics and Gynecology

## 2016-09-27 VITALS — BP 116/76 | HR 86 | Wt 312.0 lb

## 2016-09-27 DIAGNOSIS — O99210 Obesity complicating pregnancy, unspecified trimester: Secondary | ICD-10-CM

## 2016-09-27 DIAGNOSIS — O0993 Supervision of high risk pregnancy, unspecified, third trimester: Secondary | ICD-10-CM | POA: Diagnosis not present

## 2016-09-27 DIAGNOSIS — O99213 Obesity complicating pregnancy, third trimester: Secondary | ICD-10-CM

## 2016-09-27 DIAGNOSIS — R87613 High grade squamous intraepithelial lesion on cytologic smear of cervix (HGSIL): Secondary | ICD-10-CM

## 2016-09-27 DIAGNOSIS — O10913 Unspecified pre-existing hypertension complicating pregnancy, third trimester: Secondary | ICD-10-CM

## 2016-09-27 DIAGNOSIS — O0990 Supervision of high risk pregnancy, unspecified, unspecified trimester: Secondary | ICD-10-CM

## 2016-09-27 DIAGNOSIS — O10919 Unspecified pre-existing hypertension complicating pregnancy, unspecified trimester: Secondary | ICD-10-CM

## 2016-09-27 NOTE — Progress Notes (Signed)
   PRENATAL VISIT NOTE  Subjective:  Carly Hopkins is a 26 y.o. G2P1001 at 1865w1d being seen today for ongoing prenatal care.  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  Patient reports no complaints.  Contractions: Not present. Vag. Bleeding: None.  Movement: Present. Denies leaking of fluid.   The following portions of the patient's history were reviewed and updated as appropriate: allergies, current medications, past family history, past medical history, past social history, past surgical history and problem list. Problem list updated.  Objective:   Vitals:   09/27/16 0958  BP: 116/76  Pulse: 86  Weight: (!) 312 lb (141.5 kg)    Fetal Status: Fetal Heart Rate (bpm): 146 Fundal Height: 33 cm Movement: Present     General:  Alert, oriented and cooperative. Patient is in no acute distress.  Skin: Skin is warm and dry. No rash noted.   Cardiovascular: Normal heart rate noted  Respiratory: Normal respiratory effort, no problems with respiration noted  Abdomen: Soft, gravid, appropriate for gestational age.  Pain/Pressure: Absent     Pelvic: Cervical exam deferred        Extremities: Normal range of motion.  Edema: None  Mental Status:  Normal mood and affect. Normal behavior. Normal judgment and thought content.   Assessment and Plan:  Pregnancy: G2P1001 at 2765w1d  1. Supervision of high risk pregnancy, antepartum Patient is doing well without complaints Patient is interested in BTL- consent signed today  2. Chronic hypertension during pregnancy, antepartum Continue ASA and labetalol Follow up growth and BPP on 8/15 Start antenatal testing week of 8/20  3. Maternal morbid obesity, antepartum (HCC) Appropriate weight  4.  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 Please refer to After Visit Summary for other counseling recommendations.  Return in about 2 weeks (around 10/11/2016) for ROB.   Catalina AntiguaPeggy Nylia Gavina, MD

## 2016-10-08 ENCOUNTER — Ambulatory Visit (INDEPENDENT_AMBULATORY_CARE_PROVIDER_SITE_OTHER): Payer: Medicaid Other | Admitting: Obstetrics and Gynecology

## 2016-10-08 VITALS — BP 125/78 | HR 96 | Wt 318.6 lb

## 2016-10-08 DIAGNOSIS — R87613 High grade squamous intraepithelial lesion on cytologic smear of cervix (HGSIL): Secondary | ICD-10-CM

## 2016-10-08 DIAGNOSIS — O99210 Obesity complicating pregnancy, unspecified trimester: Secondary | ICD-10-CM

## 2016-10-08 DIAGNOSIS — O0990 Supervision of high risk pregnancy, unspecified, unspecified trimester: Secondary | ICD-10-CM

## 2016-10-08 DIAGNOSIS — O10913 Unspecified pre-existing hypertension complicating pregnancy, third trimester: Secondary | ICD-10-CM

## 2016-10-08 DIAGNOSIS — O10919 Unspecified pre-existing hypertension complicating pregnancy, unspecified trimester: Secondary | ICD-10-CM

## 2016-10-08 DIAGNOSIS — O99213 Obesity complicating pregnancy, third trimester: Secondary | ICD-10-CM

## 2016-10-08 DIAGNOSIS — O0993 Supervision of high risk pregnancy, unspecified, third trimester: Secondary | ICD-10-CM

## 2016-10-08 NOTE — Progress Notes (Signed)
   PRENATAL VISIT NOTE  Subjective:  Carly Hopkins is a 26 y.o. G2P1001 at 4720w5d being seen today for ongoing prenatal care.  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  Patient reports no complaints.  Contractions: Not present. Vag. Bleeding: None.  Movement: Present. Denies leaking of fluid.   The following portions of the patient's history were reviewed and updated as appropriate: allergies, current medications, past family history, past medical history, past social history, past surgical history and problem list. Problem list updated.  Objective:   Vitals:   10/08/16 1550  BP: 125/78  Pulse: 96  Weight: (!) 318 lb 9.6 oz (144.5 kg)    Fetal Status: Fetal Heart Rate (bpm): 136 Fundal Height: 32 cm Movement: Present     General:  Alert, oriented and cooperative. Patient is in no acute distress.  Skin: Skin is warm and dry. No rash noted.   Cardiovascular: Normal heart rate noted  Respiratory: Normal respiratory effort, no problems with respiration noted  Abdomen: Soft, gravid, appropriate for gestational age.  Pain/Pressure: Present     Pelvic: Cervical exam deferred        Extremities: Normal range of motion.  Edema: None  Mental Status:  Normal mood and affect. Normal behavior. Normal judgment and thought content.   Assessment and Plan:  Pregnancy: G2P1001 at 7820w5d  1. Supervision of high risk pregnancy, antepartum Patient is doing well without complaints  2. Chronic hypertension during pregnancy, antepartum Continue labetalol and ASA Follow up growth and BPP on 8/15 Start twice weekly testing with weekly AFI next week  3. Maternal morbid obesity, antepartum (HCC) Weight stable  4. Pap smear abnormality of cervix  with HGSIL LEEP pp  Preterm labor symptoms and general obstetric precautions including but not limited to vaginal bleeding, contractions, leaking of fluid and fetal movement were reviewed in detail with the patient. Please refer to After Visit Summary for other counseling recommendations.  Return in about 1 week (around 10/15/2016) for ROB, NST, AFI.   Catalina AntiguaPeggy Curstin Schmale, MD

## 2016-10-10 ENCOUNTER — Encounter (HOSPITAL_COMMUNITY): Payer: Self-pay

## 2016-10-10 ENCOUNTER — Other Ambulatory Visit (HOSPITAL_COMMUNITY): Payer: Self-pay | Admitting: *Deleted

## 2016-10-10 ENCOUNTER — Ambulatory Visit (HOSPITAL_COMMUNITY)
Admission: RE | Admit: 2016-10-10 | Discharge: 2016-10-10 | Disposition: A | Discharge status: Home / Self Care | Payer: Medicaid Other | Source: Ambulatory Visit | Attending: Obstetrics & Gynecology | Admitting: Obstetrics & Gynecology

## 2016-10-10 ENCOUNTER — Other Ambulatory Visit: Payer: Self-pay | Admitting: Obstetrics & Gynecology

## 2016-10-10 DIAGNOSIS — O99213 Obesity complicating pregnancy, third trimester: Secondary | ICD-10-CM | POA: Diagnosis not present

## 2016-10-10 DIAGNOSIS — O10919 Unspecified pre-existing hypertension complicating pregnancy, unspecified trimester: Secondary | ICD-10-CM

## 2016-10-10 DIAGNOSIS — O10013 Pre-existing essential hypertension complicating pregnancy, third trimester: Secondary | ICD-10-CM | POA: Diagnosis not present

## 2016-10-10 DIAGNOSIS — Z3A32 32 weeks gestation of pregnancy: Secondary | ICD-10-CM | POA: Diagnosis not present

## 2016-10-10 DIAGNOSIS — O10913 Unspecified pre-existing hypertension complicating pregnancy, third trimester: Secondary | ICD-10-CM | POA: Diagnosis present

## 2016-10-10 DIAGNOSIS — Z362 Encounter for other antenatal screening follow-up: Secondary | ICD-10-CM | POA: Insufficient documentation

## 2016-10-22 ENCOUNTER — Encounter: Payer: Medicaid Other | Admitting: Obstetrics and Gynecology

## 2016-10-22 ENCOUNTER — Other Ambulatory Visit: Payer: Medicaid Other

## 2016-10-25 ENCOUNTER — Other Ambulatory Visit: Payer: Medicaid Other

## 2016-10-25 ENCOUNTER — Ambulatory Visit (INDEPENDENT_AMBULATORY_CARE_PROVIDER_SITE_OTHER): Payer: Medicaid Other | Admitting: Obstetrics and Gynecology

## 2016-10-25 ENCOUNTER — Other Ambulatory Visit (INDEPENDENT_AMBULATORY_CARE_PROVIDER_SITE_OTHER): Payer: Medicaid Other

## 2016-10-25 VITALS — BP 138/79 | HR 101 | Wt 307.7 lb

## 2016-10-25 DIAGNOSIS — O0990 Supervision of high risk pregnancy, unspecified, unspecified trimester: Secondary | ICD-10-CM

## 2016-10-25 DIAGNOSIS — O99210 Obesity complicating pregnancy, unspecified trimester: Secondary | ICD-10-CM

## 2016-10-25 DIAGNOSIS — R87613 High grade squamous intraepithelial lesion on cytologic smear of cervix (HGSIL): Secondary | ICD-10-CM

## 2016-10-25 DIAGNOSIS — O10913 Unspecified pre-existing hypertension complicating pregnancy, third trimester: Secondary | ICD-10-CM | POA: Diagnosis not present

## 2016-10-25 DIAGNOSIS — O10919 Unspecified pre-existing hypertension complicating pregnancy, unspecified trimester: Secondary | ICD-10-CM

## 2016-10-25 NOTE — Progress Notes (Signed)
   PRENATAL VISIT NOTE  Subjective:  Carly Hopkins is a 26 y.o. G2P1001 at 668w1d being seen today for ongoing prenatal care.  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  Patient reports no complaints.  Contractions: Not present. Vag. Bleeding: None.  Movement: Present. Denies leaking of fluid.   The following portions of the patient's history were reviewed and updated as appropriate: allergies, current medications, past family history, past medical history, past social history, past surgical history and problem list. Problem list updated.  Objective:   Vitals:   10/25/16 0940  BP: 138/79  Pulse: (!) 101    Fetal Status: Fetal Heart Rate (bpm): AFI/NST    Movement: Present     General:  Alert, oriented and cooperative. Patient is in no acute distress.  Skin: Skin is warm and dry. No rash noted.   Cardiovascular: Normal heart rate noted  Respiratory: Normal respiratory effort, no problems with respiration noted  Abdomen: Soft, gravid, appropriate for gestational age.  Pain/Pressure: Present     Pelvic: Cervical exam deferred        Extremities: Normal range of motion.  Edema: None  Mental Status:  Normal mood and affect. Normal behavior. Normal judgment and thought content.   Assessment and Plan:  Pregnancy: G2P1001 at 648w1d  1. Supervision of high risk pregnancy, antepartum Patient is doing well without complaints  2. Chronic hypertension during pregnancy, antepartum Continue ASA Patient has stopped taking labetalol because she reports feeling dizzy for a few minutes following its ingestion. Advised patient to stay well hydrated prior to taking labetalol twice daily Follow up growth ultrasound scheduled in September NST  reviewed and reactive with baseline 130, mod variability, +accels, no decels Continue twice weekly NST with weekly AFI - US OB Limited; Future - Fetal nonstress test  3. Maternal morbid obesity, antepartum (HCC) Good weight gain during the pregnancy  4. 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 Please refer to After Visit Summary for other counseling recommendations.  No Follow-up on file.   Catalina AntiguaPeggy Cleophus Mendonsa, MD

## 2016-11-05 ENCOUNTER — Other Ambulatory Visit: Payer: Medicaid Other

## 2016-11-05 ENCOUNTER — Other Ambulatory Visit (HOSPITAL_COMMUNITY)
Admission: RE | Admit: 2016-11-05 | Discharge: 2016-11-05 | Disposition: A | Discharge status: Home / Self Care | Payer: Medicaid Other | Source: Ambulatory Visit | Attending: Obstetrics and Gynecology | Admitting: Obstetrics and Gynecology

## 2016-11-05 ENCOUNTER — Ambulatory Visit (INDEPENDENT_AMBULATORY_CARE_PROVIDER_SITE_OTHER): Payer: Medicaid Other | Admitting: Obstetrics and Gynecology

## 2016-11-05 VITALS — BP 98/47 | HR 118 | Wt 317.6 lb

## 2016-11-05 DIAGNOSIS — O10913 Unspecified pre-existing hypertension complicating pregnancy, third trimester: Secondary | ICD-10-CM | POA: Diagnosis not present

## 2016-11-05 DIAGNOSIS — O10919 Unspecified pre-existing hypertension complicating pregnancy, unspecified trimester: Secondary | ICD-10-CM

## 2016-11-05 DIAGNOSIS — O0990 Supervision of high risk pregnancy, unspecified, unspecified trimester: Secondary | ICD-10-CM | POA: Insufficient documentation

## 2016-11-05 DIAGNOSIS — O0993 Supervision of high risk pregnancy, unspecified, third trimester: Secondary | ICD-10-CM

## 2016-11-05 NOTE — Progress Notes (Signed)
   PRENATAL VISIT NOTE  Subjective:  Carly Hopkins is a 26 y.o. G2P1001 at 1922w5d being seen today for ongoing prenatal care.  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  Patient reports no complaints.  Contractions: Not present. Vag. Bleeding: None.  Movement: Present. Denies leaking of fluid.   The following portions of the patient's history were reviewed and updated as appropriate: allergies, current medications, past family history, past medical history, past social history, past surgical history and problem list. Problem list updated.  Objective:   Vitals:   11/05/16 0950  BP: (!) 98/47  Pulse: (!) 118  Weight: (!) 317 lb 9.6 oz (144.1 kg)    Fetal Status: Fetal Heart Rate (bpm): NST   Movement: Present  Presentation: Vertex  General:  Alert, oriented and cooperative. Patient is in no acute distress.  Skin: Skin is warm and dry. No rash noted.   Cardiovascular: Normal heart rate noted  Respiratory: Normal respiratory effort, no problems with respiration noted  Abdomen: Soft, gravid, appropriate for gestational age.  Pain/Pressure: Present     Pelvic: Cervical exam performed Dilation: Closed Effacement (%): Thick Station: Ballotable  Extremities: Normal range of motion.  Edema: None  Mental Status:  Normal mood and affect. Normal behavior. Normal judgment and thought content.   Assessment and Plan:  Pregnancy: G2P1001 at 5122w5d  1. Chronic hypertension during pregnancy, antepartum BP well controlled Continue ASA Follow up growth with BPP on 9/12 - Fetal nonstress test; Future- reviewed and reactive with baseline 140.mod variability, +accels, no decels - US MFM FETAL BPP WO NON STRESS; Future  2. Supervision of high risk  pregnancy, antepartum Patient is doing well Cultures collected today - Strep Gp B NAA - Cervicovaginal ancillary only  Preterm labor symptoms and general obstetric precautions including but not limited to vaginal bleeding, contractions, leaking of fluid and fetal movement were reviewed in detail with the patient. Please refer to After Visit Summary for other counseling recommendations.  No Follow-up on file.   Catalina AntiguaPeggy Amiere Cawley, MD

## 2016-11-06 LAB — CERVICOVAGINAL ANCILLARY ONLY
CHLAMYDIA, DNA PROBE: NEGATIVE
NEISSERIA GONORRHEA: NEGATIVE

## 2016-11-07 ENCOUNTER — Encounter (HOSPITAL_COMMUNITY): Payer: Self-pay

## 2016-11-07 ENCOUNTER — Ambulatory Visit (HOSPITAL_COMMUNITY)
Admission: RE | Admit: 2016-11-07 | Discharge: 2016-11-07 | Disposition: A | Discharge status: Home / Self Care | Payer: Medicaid Other | Source: Ambulatory Visit | Attending: Obstetrics and Gynecology | Admitting: Obstetrics and Gynecology

## 2016-11-07 DIAGNOSIS — Z6841 Body Mass Index (BMI) 40.0 and over, adult: Secondary | ICD-10-CM | POA: Diagnosis not present

## 2016-11-07 DIAGNOSIS — Z3A36 36 weeks gestation of pregnancy: Secondary | ICD-10-CM | POA: Insufficient documentation

## 2016-11-07 DIAGNOSIS — O99213 Obesity complicating pregnancy, third trimester: Secondary | ICD-10-CM | POA: Insufficient documentation

## 2016-11-07 DIAGNOSIS — O10919 Unspecified pre-existing hypertension complicating pregnancy, unspecified trimester: Secondary | ICD-10-CM

## 2016-11-07 DIAGNOSIS — O10013 Pre-existing essential hypertension complicating pregnancy, third trimester: Secondary | ICD-10-CM | POA: Diagnosis present

## 2016-11-07 DIAGNOSIS — Z362 Encounter for other antenatal screening follow-up: Secondary | ICD-10-CM | POA: Insufficient documentation

## 2016-11-07 LAB — STREP GP B NAA: STREP GROUP B AG: NEGATIVE

## 2016-11-14 ENCOUNTER — Ambulatory Visit (INDEPENDENT_AMBULATORY_CARE_PROVIDER_SITE_OTHER): Payer: Medicaid Other | Admitting: Certified Nurse Midwife

## 2016-11-14 ENCOUNTER — Encounter: Payer: Medicaid Other | Admitting: Obstetrics & Gynecology

## 2016-11-14 ENCOUNTER — Other Ambulatory Visit: Payer: Medicaid Other

## 2016-11-14 VITALS — BP 122/83 | HR 130 | Wt 314.0 lb

## 2016-11-14 DIAGNOSIS — O10919 Unspecified pre-existing hypertension complicating pregnancy, unspecified trimester: Secondary | ICD-10-CM

## 2016-11-14 DIAGNOSIS — O10913 Unspecified pre-existing hypertension complicating pregnancy, third trimester: Secondary | ICD-10-CM | POA: Diagnosis not present

## 2016-11-14 DIAGNOSIS — O0993 Supervision of high risk pregnancy, unspecified, third trimester: Secondary | ICD-10-CM

## 2016-11-14 DIAGNOSIS — O0990 Supervision of high risk pregnancy, unspecified, unspecified trimester: Secondary | ICD-10-CM

## 2016-11-14 NOTE — Progress Notes (Signed)
   PRENATAL VISIT NOTE  Subjective:  Carly Hopkins is a 26 y.o. G2P1001 at [redacted]w[redacted]d being seen today for ongoing prenatal care.  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  Patient reports no complaints.  Contractions: Irregular. Vag. Bleeding: None.  Movement: Present. Denies leaking of fluid.   The following portions of the patient's history were reviewed and updated as appropriate: allergies, current medications, past family history, past medical history, past social history, past surgical history and problem list. Problem list updated.  Objective:   Vitals:   11/14/16 0901  BP: 122/83  Pulse: (!) 130  Weight: (!) 314 lb (142.4 kg)    Fetal Status: Fetal Heart Rate (bpm): AFI/NST; reactive   Movement: Present  Presentation: Vertex  General:  Alert, oriented and cooperative. Patient is in no acute distress.  Skin: Skin is warm and dry. No rash noted.   Cardiovascular: Normal heart rate noted  Respiratory: Normal respiratory effort, no problems with respiration noted  Abdomen: Soft, gravid, appropriate for gestational age.  Pain/Pressure: Absent     Pelvic: Cervical exam deferred        Extremities: Normal range of motion.  Edema: None  Mental Status:  Normal mood and affect. Normal behavior. Normal judgment and thought content.  NST: + accels, no decels, moderate variability, Cat. 1 tracing. No contractions on toco.   Assessment and Plan:  Pregnancy: G2P1001 at [redacted]w[redacted]d  1. Supervision of high risk pregnancy, antepartum     Reactive NST - [place] MFM FETAL BPP W/NONSTRESS; Future - [place] MFM FETAL BPP W/NONSTRESS; Future  2. Chronic hypertension during pregnancy, antepartum     Normotensive, not on medications.  IOL scheduled for 39  weeks.  - Fetal nonstress test - US OB Limited; Future - [place] MFM FETAL BPP W/NONSTRESS; Future - [place] MFM FETAL BPP W/NONSTRESS; Future  Term labor symptoms and general obstetric precautions including but not limited to vaginal bleeding, contractions, leaking of fluid and fetal movement were reviewed in detail with the patient. Please refer to After Visit Summary for other counseling recommendations.  Return in about 1 week (around 11/21/2016) for [facility].   Roe Coombs, CNM

## 2016-11-19 ENCOUNTER — Encounter (HOSPITAL_COMMUNITY): Payer: Self-pay

## 2016-11-19 ENCOUNTER — Ambulatory Visit (INDEPENDENT_AMBULATORY_CARE_PROVIDER_SITE_OTHER): Payer: Medicaid Other | Admitting: Obstetrics and Gynecology

## 2016-11-19 VITALS — BP 114/56 | HR 81 | Wt 314.6 lb

## 2016-11-19 DIAGNOSIS — O10913 Unspecified pre-existing hypertension complicating pregnancy, third trimester: Secondary | ICD-10-CM | POA: Diagnosis not present

## 2016-11-19 DIAGNOSIS — O0990 Supervision of high risk pregnancy, unspecified, unspecified trimester: Secondary | ICD-10-CM

## 2016-11-19 DIAGNOSIS — Z23 Encounter for immunization: Secondary | ICD-10-CM | POA: Diagnosis not present

## 2016-11-19 DIAGNOSIS — O10919 Unspecified pre-existing hypertension complicating pregnancy, unspecified trimester: Secondary | ICD-10-CM

## 2016-11-20 NOTE — Progress Notes (Signed)
NST reactive.

## 2016-11-21 ENCOUNTER — Other Ambulatory Visit: Payer: Self-pay | Admitting: Certified Nurse Midwife

## 2016-11-21 ENCOUNTER — Encounter (HOSPITAL_COMMUNITY): Payer: Self-pay

## 2016-11-21 ENCOUNTER — Ambulatory Visit (HOSPITAL_COMMUNITY)
Admission: RE | Admit: 2016-11-21 | Discharge: 2016-11-21 | Disposition: A | Discharge status: Home / Self Care | Payer: Medicaid Other | Source: Ambulatory Visit | Attending: Certified Nurse Midwife | Admitting: Certified Nurse Midwife

## 2016-11-21 ENCOUNTER — Encounter (HOSPITAL_COMMUNITY): Payer: Self-pay | Admitting: *Deleted

## 2016-11-21 ENCOUNTER — Telehealth (HOSPITAL_COMMUNITY): Payer: Self-pay | Admitting: *Deleted

## 2016-11-21 DIAGNOSIS — Z3A38 38 weeks gestation of pregnancy: Secondary | ICD-10-CM | POA: Diagnosis not present

## 2016-11-21 DIAGNOSIS — O10919 Unspecified pre-existing hypertension complicating pregnancy, unspecified trimester: Secondary | ICD-10-CM

## 2016-11-21 DIAGNOSIS — O0990 Supervision of high risk pregnancy, unspecified, unspecified trimester: Secondary | ICD-10-CM

## 2016-11-21 DIAGNOSIS — O10013 Pre-existing essential hypertension complicating pregnancy, third trimester: Secondary | ICD-10-CM | POA: Insufficient documentation

## 2016-11-21 DIAGNOSIS — O99213 Obesity complicating pregnancy, third trimester: Secondary | ICD-10-CM | POA: Insufficient documentation

## 2016-11-21 NOTE — Telephone Encounter (Signed)
Preadmission screen  

## 2016-11-22 ENCOUNTER — Other Ambulatory Visit: Payer: Medicaid Other

## 2016-11-22 ENCOUNTER — Ambulatory Visit (INDEPENDENT_AMBULATORY_CARE_PROVIDER_SITE_OTHER): Payer: Medicaid Other | Admitting: Certified Nurse Midwife

## 2016-11-22 VITALS — BP 127/66 | HR 97 | Wt 313.0 lb

## 2016-11-22 DIAGNOSIS — Z3A38 38 weeks gestation of pregnancy: Secondary | ICD-10-CM

## 2016-11-22 DIAGNOSIS — O0993 Supervision of high risk pregnancy, unspecified, third trimester: Secondary | ICD-10-CM

## 2016-11-22 DIAGNOSIS — O10919 Unspecified pre-existing hypertension complicating pregnancy, unspecified trimester: Secondary | ICD-10-CM

## 2016-11-22 DIAGNOSIS — O0990 Supervision of high risk pregnancy, unspecified, unspecified trimester: Secondary | ICD-10-CM

## 2016-11-22 DIAGNOSIS — O133 Gestational [pregnancy-induced] hypertension without significant proteinuria, third trimester: Secondary | ICD-10-CM

## 2016-11-22 DIAGNOSIS — O10913 Unspecified pre-existing hypertension complicating pregnancy, third trimester: Secondary | ICD-10-CM

## 2016-11-22 NOTE — Progress Notes (Signed)
Pt presents for ROB and NST dx: CHTN. BPP u/s completed yesterday. Pt c/o hiccupping during entire pregnancy, it's becoming frustrating.

## 2016-11-22 NOTE — Progress Notes (Signed)
   PRENATAL VISIT NOTE  Subjective:  Carly Hopkins is a 26 y.o. G2P1001 at [redacted]w[redacted]d being seen today for ongoing prenatal care.  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  Patient reports no complaints.  Contractions: Not present. Vag. Bleeding: None.  Movement: Present. Denies leaking of fluid.   The following portions of the patient's history were reviewed and updated as appropriate: allergies, current medications, past family history, past medical history, past social history, past surgical history and problem list. Problem list updated.  Objective:   Vitals:   11/22/16 1015  BP: 127/66  Pulse: 97  Weight: (!) 313 lb (142 kg)    Fetal Status: Fetal Heart Rate (bpm): NST   Movement: Present     General:  Alert, oriented and cooperative. Patient is in no acute distress.  Skin: Skin is warm and dry. No rash noted.   Cardiovascular: Normal heart rate noted  Respiratory: Normal respiratory effort, no problems with respiration noted  Abdomen: Soft, gravid, appropriate for gestational age.  Pain/Pressure: Present     Pelvic: Cervical exam deferred        Extremities: Normal range of motion.  Edema: None  Mental Status:  Normal mood and affect. Normal behavior. Normal judgment and thought content.  NST: + accels, no decels, moderate variability, Cat. 1 tracing. No contractions on toco.   Assessment and Plan:  Pregnancy: G2P1001 at [redacted]w[redacted]d  1. Chronic hypertension during pregnancy, antepartum     Reactive NST.  BPP 11/21/16 normal 8/8 - Fetal nonstress test  2. Supervision of high risk pregnancy, antepartum     Doing well. IOL scheduled 11/28/16.    Term labor symptoms and general obstetric precautions including but not limited to  vaginal bleeding, contractions, leaking of fluid and fetal movement were reviewed in detail with the patient. Please refer to After Visit Summary for other counseling recommendations.  Return in about 1 week (around 11/29/2016) for [facility], NST.   Roe Coombs, CNM

## 2016-11-23 ENCOUNTER — Other Ambulatory Visit: Payer: Self-pay | Admitting: Advanced Practice Midwife

## 2016-11-28 ENCOUNTER — Inpatient Hospital Stay (HOSPITAL_COMMUNITY): Payer: Medicaid Other | Admitting: Anesthesiology

## 2016-11-28 ENCOUNTER — Inpatient Hospital Stay (HOSPITAL_COMMUNITY)
Admission: RE | Admit: 2016-11-28 | Discharge: 2016-11-30 | DRG: 806 | Disposition: A | Discharge status: Home / Self Care | Payer: Medicaid Other | Source: Ambulatory Visit | Attending: Obstetrics & Gynecology | Admitting: Obstetrics & Gynecology

## 2016-11-28 ENCOUNTER — Encounter (HOSPITAL_COMMUNITY): Payer: Self-pay

## 2016-11-28 ENCOUNTER — Inpatient Hospital Stay (HOSPITAL_COMMUNITY): Admission: RE | Admit: 2016-11-28 | Payer: Medicaid Other | Source: Ambulatory Visit

## 2016-11-28 DIAGNOSIS — O1002 Pre-existing essential hypertension complicating childbirth: Principal | ICD-10-CM | POA: Diagnosis present

## 2016-11-28 DIAGNOSIS — O99210 Obesity complicating pregnancy, unspecified trimester: Secondary | ICD-10-CM

## 2016-11-28 DIAGNOSIS — Z3009 Encounter for other general counseling and advice on contraception: Secondary | ICD-10-CM

## 2016-11-28 DIAGNOSIS — Z6841 Body Mass Index (BMI) 40.0 and over, adult: Secondary | ICD-10-CM | POA: Diagnosis not present

## 2016-11-28 DIAGNOSIS — O99214 Obesity complicating childbirth: Secondary | ICD-10-CM | POA: Diagnosis present

## 2016-11-28 DIAGNOSIS — O0990 Supervision of high risk pregnancy, unspecified, unspecified trimester: Secondary | ICD-10-CM

## 2016-11-28 DIAGNOSIS — O10919 Unspecified pre-existing hypertension complicating pregnancy, unspecified trimester: Secondary | ICD-10-CM | POA: Diagnosis present

## 2016-11-28 DIAGNOSIS — Z3A39 39 weeks gestation of pregnancy: Secondary | ICD-10-CM

## 2016-11-28 LAB — RPR: RPR Ser Ql: NONREACTIVE

## 2016-11-28 LAB — CBC
HCT: 30.2 % — ABNORMAL LOW (ref 36.0–46.0)
HEMATOCRIT: 30.3 % — AB (ref 36.0–46.0)
HEMOGLOBIN: 9.8 g/dL — AB (ref 12.0–15.0)
Hemoglobin: 9.9 g/dL — ABNORMAL LOW (ref 12.0–15.0)
MCH: 26.2 pg (ref 26.0–34.0)
MCH: 26.1 pg (ref 26.0–34.0)
MCHC: 32.8 g/dL (ref 30.0–36.0)
MCHC: 32.3 g/dL (ref 30.0–36.0)
MCV: 79.9 fL (ref 78.0–100.0)
MCV: 80.8 fL (ref 78.0–100.0)
PLATELETS: 269 10*3/uL (ref 150–400)
Platelets: 264 10*3/uL (ref 150–400)
RBC: 3.78 MIL/uL — ABNORMAL LOW (ref 3.87–5.11)
RBC: 3.75 MIL/uL — AB (ref 3.87–5.11)
RDW: 15.6 % — ABNORMAL HIGH (ref 11.5–15.5)
RDW: 15.7 % — AB (ref 11.5–15.5)
WBC: 11.4 10*3/uL — ABNORMAL HIGH (ref 4.0–10.5)
WBC: 15.2 10*3/uL — ABNORMAL HIGH (ref 4.0–10.5)

## 2016-11-28 LAB — TYPE AND SCREEN
ABO/RH(D): O POS
Antibody Screen: NEGATIVE

## 2016-11-28 LAB — ABO/RH: ABO/RH(D): O POS

## 2016-11-28 MED ORDER — OXYTOCIN BOLUS FROM INFUSION
500.0000 mL | Freq: Once | INTRAVENOUS | Status: AC
  Administered 2016-11-28: 500 mL via INTRAVENOUS

## 2016-11-28 MED ORDER — FENTANYL CITRATE (PF) 100 MCG/2ML IJ SOLN: 100.0000 ug | INTRAMUSCULAR | Status: DC | PRN

## 2016-11-28 MED ORDER — LACTATED RINGERS IV SOLN
INTRAVENOUS | Status: DC
  Administered 2016-11-28 (×3): via INTRAVENOUS

## 2016-11-28 MED ORDER — PHENYLEPHRINE 40 MCG/ML (10ML) SYRINGE FOR IV PUSH (FOR BLOOD PRESSURE SUPPORT)
80.0000 ug | PREFILLED_SYRINGE | INTRAVENOUS | Status: DC | PRN
  Filled 2016-11-28: qty 10
  Filled 2016-11-28: qty 5

## 2016-11-28 MED ORDER — DIPHENHYDRAMINE HCL 50 MG/ML IJ SOLN: 12.5000 mg | INTRAMUSCULAR | Status: DC | PRN

## 2016-11-28 MED ORDER — SOD CITRATE-CITRIC ACID 500-334 MG/5ML PO SOLN: 30.0000 mL | ORAL | Status: DC | PRN

## 2016-11-28 MED ORDER — LACTATED RINGERS IV SOLN: 500.0000 mL | INTRAVENOUS | Status: DC | PRN

## 2016-11-28 MED ORDER — ONDANSETRON HCL 4 MG/2ML IJ SOLN
4.0000 mg | Freq: Four times a day (QID) | INTRAMUSCULAR | Status: DC | PRN
Start: 2016-11-28 — End: 2016-11-29

## 2016-11-28 MED ORDER — EPHEDRINE 5 MG/ML INJ
10.0000 mg | INTRAVENOUS | Status: DC | PRN
  Filled 2016-11-28: qty 2

## 2016-11-28 MED ORDER — ACETAMINOPHEN 325 MG PO TABS
650.0000 mg | ORAL_TABLET | ORAL | Status: DC | PRN
  Administered 2016-11-28: 650 mg via ORAL
  Filled 2016-11-28: qty 2

## 2016-11-28 MED ORDER — FENTANYL 2.5 MCG/ML BUPIVACAINE 1/10 % EPIDURAL INFUSION (WH - ANES)
14.0000 mL/h | INTRAMUSCULAR | Status: DC | PRN
  Administered 2016-11-28: 14 mL/h via EPIDURAL
  Filled 2016-11-28: qty 100

## 2016-11-28 MED ORDER — OXYTOCIN 40 UNITS IN LACTATED RINGERS INFUSION - SIMPLE MED
1.0000 m[IU]/min | INTRAVENOUS | Status: DC
  Administered 2016-11-28: 2 m[IU]/min via INTRAVENOUS

## 2016-11-28 MED ORDER — MISOPROSTOL 25 MCG QUARTER TABLET
25.0000 ug | ORAL_TABLET | ORAL | Status: DC | PRN
  Administered 2016-11-28: 25 ug via VAGINAL
  Filled 2016-11-28 (×3): qty 1

## 2016-11-28 MED ORDER — LIDOCAINE HCL (PF) 1 % IJ SOLN
30.0000 mL | INTRAMUSCULAR | Status: DC | PRN
  Filled 2016-11-28: qty 30

## 2016-11-28 MED ORDER — PHENYLEPHRINE 40 MCG/ML (10ML) SYRINGE FOR IV PUSH (FOR BLOOD PRESSURE SUPPORT)
80.0000 ug | PREFILLED_SYRINGE | INTRAVENOUS | Status: DC | PRN
  Filled 2016-11-28: qty 5

## 2016-11-28 MED ORDER — LACTATED RINGERS IV SOLN: 500.0000 mL | Freq: Once | INTRAVENOUS | Status: DC

## 2016-11-28 MED ORDER — OXYCODONE-ACETAMINOPHEN 5-325 MG PO TABS: 1.0000 | ORAL_TABLET | ORAL | Status: DC | PRN

## 2016-11-28 MED ORDER — TERBUTALINE SULFATE 1 MG/ML IJ SOLN
0.2500 mg | Freq: Once | INTRAMUSCULAR | Status: DC | PRN
  Filled 2016-11-28: qty 1

## 2016-11-28 MED ORDER — OXYTOCIN 40 UNITS IN LACTATED RINGERS INFUSION - SIMPLE MED
2.5000 [IU]/h | INTRAVENOUS | Status: DC
  Administered 2016-11-29: 2.5 [IU]/h via INTRAVENOUS
  Filled 2016-11-28: qty 1000

## 2016-11-28 MED ORDER — LIDOCAINE HCL (PF) 1 % IJ SOLN
INTRAMUSCULAR | Status: DC | PRN
  Administered 2016-11-28 (×2): 5 mL

## 2016-11-28 MED ORDER — OXYCODONE-ACETAMINOPHEN 5-325 MG PO TABS: 2.0000 | ORAL_TABLET | ORAL | Status: DC | PRN

## 2016-11-28 NOTE — Progress Notes (Signed)
OB Interim Progress Note  S: Doing well. Epidural placed and she is comfortable.   O: BP (!) 118/48   Pulse 74   Temp 98.6 F (37 C) (Oral)   Resp 18   Ht  (1.753 m)   Wt (!) 142.9 kg (315 lb)   LMP 02/29/2016 (Exact Date)   BMI 46.52 kg/m    Dilation: 10 Dilation Complete Date: 11/28/16 Dilation Complete Time: 2319 Effacement (%): 100 Cervical Position: Posterior Station: +2 Presentation: Vertex Exam by:: Mervin Kung, RN  FHT: 155 BMP, good variability and accelerations, variable decels UC: q1.5-2 min, regular  A/P: Ruptured membranes at 2300, large amount of clear odorless fluid. Placed scalp electrode and IUPC without difficulty.  Continue expectant management Anticipate SVD  Renne Musca, MD 11/28/2016, 11:25 PM PGY-1

## 2016-11-28 NOTE — H&P (Signed)
Carly Hopkins is a 26 y.o. female presenting for IOL for CHTN, obesity. Feels no contractions. Good fetal movement. Antenatal testing normal. Last EFW was on 9/12 - 6#.  OB History    Gravida Para Term Preterm AB Living   SAB TAB Ectopic Multiple Live Births           1     Past Medical History:  Diagnosis Date  . 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 Hypertension   . Vaginal Pap smear, abnormal    Past Surgical History:  Procedure Laterality Date  . NO PAST SURGERIES     Family History: family history includes Diabetes in her maternal grandfather, maternal grandmother, and mother; Hypertension in her maternal grandfather, maternal grandmother, and mother. Social History:  reports that she has never smoked. She has never used smokeless tobacco. She reports that she does not drink alcohol or use drugs.     Maternal Diabetes: No Genetic Screening: Normal Maternal Ultrasounds/Referrals: Normal Fetal Ultrasounds or other Referrals:  None Maternal Substance Abuse:  No Significant Maternal Medications:  Meds include: Other: labetalol Significant Maternal Lab Results:  Lab values include: Group B Strep negative Other Comments:  None  ROS History   Blood pressure (!) 115/50, pulse 65, resp. rate 16, last menstrual period 02/29/2016. Maternal Exam:  Abdomen: Patient reports no abdominal tenderness. Fundal height is term.   Estimated fetal weight is 7#8oz.   Fetal presentation: vertex  Introitus: Normal vulva. Normal vagina.  Pelvis: adequate for delivery.   Cervix: Cervix evaluated by digital exam.     Physical Exam  Constitutional: She appears well-developed and well-nourished.  Cardiovascular: Normal rate.   Respiratory: Effort normal.  GI: Soft. Bowel sounds are normal. She exhibits no distension. There is no tenderness. There is no rebound and no guarding.  Skin: Skin is warm and dry.  Psychiatric: She has a normal mood and affect. Her  behavior is normal. Judgment and thought content normal.    Dilation: 1.5 Effacement (%): 40 Cervical Position: Anterior Station: -2 Presentation: Vertex Exam by:: Stinson, DO   Prenatal labs: ABO, Rh: O/Positive/-- (04/09 1348) Antibody: Negative (04/09 1348) Rubella: @ RPR: Non Reactive (07/19 1100)  HBsAg: Negative (04/09 1348)  HIV:    GBS: Negative (09/10 1018)   Assessment/Plan: Wants BTL Bott[facility] for peds Epidural upon request  Foley balloon and cytotec   Levie Heritage 11/28/2016, 7:57 AM

## 2016-11-28 NOTE — Anesthesia Preprocedure Evaluation (Signed)
Anesthesia Evaluation  Patient identified by MRN, date of birth, ID band Patient awake    Reviewed: Allergy & Precautions, H&P , NPO status , Patient's Chart, lab work & pertinent test results  History of Anesthesia Complications Negative for: history of anesthetic complications  Airway Mallampati: II  TM Distance: >3 FB Neck ROM: full    Dental no notable dental hx. (+) Teeth Intact   Pulmonary neg pulmonary ROS,    Pulmonary exam normal breath sounds clear to auscultation       Cardiovascular hypertension, Normal cardiovascular exam Rhythm:regular Rate:Normal     Neuro/Psych negative neurological ROS  negative psych ROS   GI/Hepatic negative GI ROS, Neg liver ROS,   Endo/Other  Morbid obesity  Renal/GU negative Renal ROS  negative genitourinary   Musculoskeletal   Abdominal   Peds  Hematology negative hematology ROS (+)   Anesthesia Other Findings   Reproductive/Obstetrics (+) Pregnancy                             Anesthesia Physical Anesthesia Plan  ASA: III  Anesthesia Plan: Epidural   Post-op Pain Management:    Induction:   PONV Risk Score and Plan:   Airway Management Planned:   Additional Equipment:   Intra-op Plan:   Post-operative Plan:   Informed Consent: I have reviewed the patients History and Physical, chart, labs and discussed the procedure including the risks, benefits and alternatives for the proposed anesthesia with the patient or authorized representative who has indicated his/her understanding and acceptance.       Plan Discussed with:   Anesthesia Plan Comments:         Anesthesia Quick Evaluation  

## 2016-11-28 NOTE — Progress Notes (Signed)
Carly Hopkins is a 26 y.o. G2P1001 at [redacted]w[redacted]d admitted for induction of labor due to Hypertension.  Subjective: She is doing well resting comfortably in bed.  Objective: BP 136/68   Pulse 86   Temp 98.2 F (36.8 C) (Oral)   Resp 16   LMP 02/29/2016 (Exact Date)  No intake/output data recorded. No intake/output data recorded.  FHT:  FHR: 140 bpm, variability: moderate,  accelerations:  Present,  decelerations:  Absent UC:   regular, every 10 minutes SVE:   Dilation: 4 Effacement (%): 40 Station: -2 Exam by:: Dr Nira Retort  Labs: Lab Results  Component Value Date   WBC 11.4 (H) 11/28/2016   HGB 9.9 (L) 11/28/2016   HCT 30.2 (L) 11/28/2016   MCV 79.9 11/28/2016   PLT 264 11/28/2016    Assessment / Plan: Induction of labor due to [facility] medical conditions,  progressing well on pitocin  Labor: Currently on Pitocin Preeclampsia:  labs stable Fetal Wellbeing:  Category I Pain Control:  Epidural I/D:  n/a Anticipated MOD:  NSVD  Carly Hopkins 11/28/2016, 2:16 PM

## 2016-11-28 NOTE — Progress Notes (Signed)
Carly Hopkins is a 25 y.o. G2P1001 at [redacted]w[redacted]d admitted for induction of labor due to [facility].  Subjective: She is doing well with no complaints at this time. She endorses moderate contractions that are not extremely painful. She is asking about when she will get her epidural. Explained to patient when she has more contractions and has more cervical change we will give it.  Objective: BP 125/61   Pulse 69   Temp 98.1 F (36.7 C) (Oral)   Resp 16   LMP 02/29/2016 (Exact Date)  No intake/output data recorded. No intake/output data recorded.  FHT:  FHR: 135 bpm, variability: moderate,  accelerations:  Present,  decelerations:  Absent UC:   irregular, every 6 minutes SVE:   Dilation: 5 Effacement (%): 50, 60 Station: -3 Exam by:: S Nix RN  Labs: Lab Results  Component Value Date   WBC 11.4 (H) 11/28/2016   HGB 9.9 (L) 11/28/2016   HCT 30.2 (L) 11/28/2016   MCV 79.9 11/28/2016   PLT 264 11/28/2016    Assessment / Plan: Induction of labor due to [facility],  progressing well on pitocin  Labor: Progressing on Pitocin, will continue to increase then AROM Preeclampsia:  no signs or symptoms of toxicity Fetal Wellbeing:  Category I Pain Control:  Epidural I/D:  n/a Anticipated MOD:  NSVD  Arlyce Harman 11/28/2016, 6:33 PM

## 2016-11-28 NOTE — Progress Notes (Signed)
Patient ID: Carly Hopkins, female   DOB: Jul 05, 1990, 26 y.o.   MRN: [id number]  Comfortable w/ epidural  BP 133/85, other VSS FHR 140s, +accels, occ mi variables. +LTV Ctx q 1-2 min w/ 65mu/min Pit Cx 5-6cm; AROM for clear fluid and internals placed  IUP@term  Early active labor cHTN  Plan to keep MVUs adequate Check cx in 2hrs Anticipate SVD  Cam Hai CNM 11/28/2016 11:24 PM

## 2016-11-28 NOTE — Anesthesia Procedure Notes (Signed)
Epidural Patient location during procedure: OB  Staffing Anesthesiologist: Rally Ouch Performed: anesthesiologist   Preanesthetic Checklist Completed: patient identified, site marked, surgical consent, pre-op evaluation, timeout performed, IV checked, risks and benefits discussed and monitors and equipment checked  Epidural Patient position: sitting Prep: DuraPrep Patient monitoring: heart rate, continuous pulse ox and blood pressure Approach: midline Location: L3-L4 Injection technique: LOR saline  Needle:  Needle type: Tuohy  Needle gauge: 17 G Needle length: 9 cm and 9 Needle insertion depth: 9 cm Catheter type: closed end flexible Catheter size: 20 Guage Catheter at skin depth: 13 cm Test dose: negative  Assessment Events: blood not aspirated, injection not painful, no injection resistance, negative IV test and no paresthesia  Additional Notes Patient identified. Risks/Benefits/Options discussed with patient including but not limited to bleeding, infection, nerve damage, paralysis, failed block, incomplete pain control, headache, blood pressure changes, nausea, vomiting, reactions to medication both or allergic, itching and postpartum back pain. Confirmed with bedside nurse the patient's most recent platelet count. Confirmed with patient that they are not currently taking any anticoagulation, have any bleeding history or any family history of bleeding disorders. Patient expressed understanding and wished to proceed. All questions were answered. Sterile technique was used throughout the entire procedure. Please see nursing notes for vital signs. Test dose was given through epidural needle and negative prior to continuing to dose epidural or start infusion. Warning signs of high block given to the patient including shortness of breath, tingling/numbness in hands, complete motor block, or any concerning symptoms with instructions to call for help. Patient was given instructions  on fall risk and not to get out of bed. All questions and concerns addressed with instructions to call with any issues.     

## 2016-11-28 NOTE — Anesthesia Pain Management Evaluation Note (Signed)
  CRNA Pain Management Visit Note  Patient: Carly Hopkins, 26 y.o., female  "Hello I am a member of the anesthesia team at [facility]. We have an anesthesia team available at all times to provide care throughout the hospital, including epidural management and anesthesia for C-section. I don't know your plan for the delivery whether it a natural birth, water birth, IV sedation, nitrous supplementation, doula or epidural, but we want to meet your pain goals."   1.Was your pain managed to your expectations on prior hospitalizations?   Yes   2.What is your expectation for pain management during this hospitalization?     Epidural and IV pain meds  3.How can we help you reach that goal? Be available  Record the patient's initial score and the patient's pain goal.   Pain: 5  Pain Goal: 5 [facility] wants you to be able to say your pain was always managed very well.  [facility] 11/28/2016

## 2016-11-29 ENCOUNTER — Encounter (HOSPITAL_COMMUNITY)
Admission: RE | Disposition: A | Discharge status: Home / Self Care | Payer: Self-pay | Source: Ambulatory Visit | Attending: Obstetrics & Gynecology

## 2016-11-29 ENCOUNTER — Encounter (HOSPITAL_COMMUNITY): Payer: Self-pay

## 2016-11-29 DIAGNOSIS — Z3A39 39 weeks gestation of pregnancy: Secondary | ICD-10-CM

## 2016-11-29 LAB — CBC
HEMATOCRIT: 28.6 % — AB (ref 36.0–46.0)
HEMOGLOBIN: 9.5 g/dL — AB (ref 12.0–15.0)
MCH: 26.7 pg (ref 26.0–34.0)
MCHC: 33.2 g/dL (ref 30.0–36.0)
MCV: 80.3 fL (ref 78.0–100.0)
Platelets: 247 10*3/uL (ref 150–400)
RBC: 3.56 MIL/uL — AB (ref 3.87–5.11)
RDW: 15.9 % — ABNORMAL HIGH (ref 11.5–15.5)
WBC: 21.5 10*3/uL — AB (ref 4.0–10.5)

## 2016-11-29 LAB — CREATININE, SERUM: Creatinine, Ser: 0.59 mg/dL (ref 0.44–1.00)

## 2016-11-29 SURGERY — LIGATION, FALLOPIAN TUBE, POSTPARTUM
Anesthesia: Epidural

## 2016-11-29 MED ORDER — IBUPROFEN 600 MG PO TABS
600.0000 mg | ORAL_TABLET | Freq: Four times a day (QID) | ORAL | Status: DC
  Administered 2016-11-29 – 2016-11-30 (×6): 600 mg via ORAL
  Filled 2016-11-29 (×6): qty 1

## 2016-11-29 MED ORDER — SIMETHICONE 80 MG PO CHEW: 80.0000 mg | CHEWABLE_TABLET | ORAL | Status: DC | PRN

## 2016-11-29 MED ORDER — METOCLOPRAMIDE HCL 10 MG PO TABS
10.0000 mg | ORAL_TABLET | Freq: Once | ORAL | Status: DC
  Filled 2016-11-29: qty 1

## 2016-11-29 MED ORDER — ACETAMINOPHEN 325 MG PO TABS: 650.0000 mg | ORAL_TABLET | ORAL | Status: DC | PRN

## 2016-11-29 MED ORDER — MEDROXYPROGESTERONE ACETATE 150 MG/ML IM SUSP
150.0000 mg | Freq: Once | INTRAMUSCULAR | Status: AC
  Administered 2016-11-30: 150 mg via INTRAMUSCULAR
  Filled 2016-11-29: qty 1

## 2016-11-29 MED ORDER — WITCH HAZEL-GLYCERIN EX PADS: 1.0000 "application " | MEDICATED_PAD | CUTANEOUS | Status: DC | PRN

## 2016-11-29 MED ORDER — FAMOTIDINE 20 MG PO TABS
40.0000 mg | ORAL_TABLET | Freq: Once | ORAL | Status: DC
  Filled 2016-11-29: qty 2

## 2016-11-29 MED ORDER — SENNOSIDES-DOCUSATE SODIUM 8.6-50 MG PO TABS
2.0000 | ORAL_TABLET | ORAL | Status: DC
  Administered 2016-11-29: 2 via ORAL
  Filled 2016-11-29: qty 2

## 2016-11-29 MED ORDER — PRENATAL MULTIVITAMIN CH
1.0000 | ORAL_TABLET | Freq: Every day | ORAL | Status: DC
  Administered 2016-11-29 – 2016-11-30 (×2): 1 via ORAL
  Filled 2016-11-29 (×2): qty 1

## 2016-11-29 MED ORDER — BENZOCAINE-MENTHOL 20-0.5 % EX AERO: 1.0000 "application " | INHALATION_SPRAY | CUTANEOUS | Status: DC | PRN

## 2016-11-29 MED ORDER — DIBUCAINE 1 % RE OINT: 1.0000 "application " | TOPICAL_OINTMENT | RECTAL | Status: DC | PRN

## 2016-11-29 MED ORDER — ENOXAPARIN SODIUM 40 MG/0.4ML ~~LOC~~ SOLN
40.0000 mg | SUBCUTANEOUS | Status: DC
  Administered 2016-11-29: 40 mg via SUBCUTANEOUS
  Filled 2016-11-29 (×3): qty 0.4

## 2016-11-29 MED ORDER — TETANUS-DIPHTH-ACELL PERTUSSIS 5-2.5-18.5 LF-MCG/0.5 IM SUSP: 0.5000 mL | Freq: Once | INTRAMUSCULAR | Status: DC

## 2016-11-29 MED ORDER — ONDANSETRON HCL 4 MG PO TABS: 4.0000 mg | ORAL_TABLET | ORAL | Status: DC | PRN

## 2016-11-29 MED ORDER — COCONUT OIL OIL: 1.0000 "application " | TOPICAL_OIL | Status: DC | PRN

## 2016-11-29 MED ORDER — ONDANSETRON HCL 4 MG/2ML IJ SOLN
4.0000 mg | INTRAMUSCULAR | Status: DC | PRN
Start: 2016-11-29 — End: 2016-11-30

## 2016-11-29 MED ORDER — DIPHENHYDRAMINE HCL 25 MG PO CAPS: 25.0000 mg | ORAL_CAPSULE | Freq: Four times a day (QID) | ORAL | Status: DC | PRN

## 2016-11-29 MED ORDER — LACTATED RINGERS IV SOLN
INTRAVENOUS | Status: DC
  Administered 2016-11-29: 1 mL via INTRAVENOUS

## 2016-11-29 MED ORDER — ZOLPIDEM TARTRATE 5 MG PO TABS: 5.0000 mg | ORAL_TABLET | Freq: Every evening | ORAL | Status: DC | PRN

## 2016-11-29 NOTE — Progress Notes (Signed)
Dr. Macon Large in patients room.  Decision made to postpone tubal due to increased BMI

## 2016-11-29 NOTE — Progress Notes (Signed)
Patient prepped for surgery.  IV fluids started. Assessment completed. CHG bath completed

## 2016-11-29 NOTE — Progress Notes (Signed)
26 y.o. G2P1001 at [redacted]w[redacted]d delivered a viable female infant in cephalic, LOA position. no nuchal cord, anterior shoulder delivered with ease. 60 sec delayed cord clamping. Cord clamped x2 and cut. Placenta delivered spontaneously intact, with 3VC. Fundus firm on exam with massage and pitocin. Good hemostasis noted.  Laceration: 1st degree perineal  Suture: none Good hemostasis noted.  Mom and baby recovering in LDR.    Apgars: 8/9 Weight: pending    Renne Musca, MD PGY-2 11/29/2016, 12:25 AM   I have seen and examined this patient and I agree with the above. See Delivery Summary. Tommy Goostree 1:46 AM 11/29/2016

## 2016-11-29 NOTE — Anesthesia Postprocedure Evaluation (Signed)
Anesthesia Post Note  Patient: Carly Hopkins  Procedure(s) Performed: AN AD HOC LABOR EPIDURAL     Patient location during evaluation: Mother Baby Anesthesia Type: Epidural Level of consciousness: awake and alert and oriented Pain management: pain level controlled Vital Signs Assessment: post-procedure vital signs reviewed and stable Respiratory status: spontaneous breathing and nonlabored ventilation Cardiovascular status: stable Postop Assessment: no headache, no apparent nausea or vomiting, no backache, epidural receding and patient able to bend at knees Anesthetic complications: no    Last Vitals:  Vitals:   11/29/16 0237 11/29/16 0500  BP: (!) 120/50 125/65  Pulse: 97 (!) 59  Resp: 18 18  Temp: 37.1 C 37.1 C    Last Pain:  Vitals:   11/29/16 0540  TempSrc:   PainSc: 0-No pain   Pain Goal:                 [employer]

## 2016-11-29 NOTE — Progress Notes (Signed)
Post Partum Day 1  Subjective: Carly Hopkins is a 26 y.o. G2P1001 [redacted]w[redacted]d s/p IOL due to [place].  No acute events overnight.  Pt denies problems with ambulating, voiding or po intake.  She denies nausea or vomiting.  Pain is well controlled. Lochia Moderate - like a normal period, but slowing down.  Desired plan for birth control is bilateral tubal ligation.  Method of Feeding: Bottle.   Objective: BP 125/65 (BP Location: Right Arm)   Pulse (!) 59   Temp 98.8 F (37.1 C) (Oral)   Resp 18   Ht  (1.753 m)   Wt (!) 142.9 kg (315 lb)   LMP 02/29/2016 (Exact Date)   BMI 46.52 kg/m   Physical Exam:  General: alert, cooperative and no distress Lochia:normal flow Chest: Normal respiratory effort Abdomen: +BS, soft, nontender, fundus firm at/below umbilicus Uterine Fundus: firm DVT Evaluation: No evidence of DVT seen on physical exam. Extremities: No lower extremity edema   Recent Labs  11/28/16 2037 11/29/16 0523  HGB 9.8* 9.5*  HCT 30.3* 28.6*    Assessment/Plan:  ASSESSMENT: Carly Hopkins is a 26 y.o. G2P1001 [redacted]w[redacted]d ppd #1 s/p IOL doing well.   Plan for discharge tomorrow   LOS: 1 day   Josephine Igo Medical Student 11/29/2016, 7:49 AM    Attestation of Attending Supervision of Medical Student: Evaluation and management procedures were performed by the Medical Student under my supervision. I confirm that I have verified the information documented in the medical student's note, and that I have also personally reperformed the physical exam and all medical decision making activities. I agree with management and plan as outlined in the documentation.   Patient examined and she has a significant abdominal girth.  Estimated distance from umbilicus to peritoneum > 10 cm. Given this increased distance, immediate postpartum BTS would be very technically difficult and can incur increased risks of bleeding, seromas etc. As such, I recommended against this procedure today.  Offered IUD vs interval BTS, still reinforced that interval BTS via laparoscopy may still be difficult due to habitus, but is overall less invasive.  IUD was heavily recommended.  She declined IUD, desires interval BTS. This will be scheduled for patient. Depo Provera will be given to her prior to discharge, as per her request.  Will continue routine postpartum care.    Jaynie Collins, MD, FACOG Attending Obstetrician & Gynecologist Faculty Practice, [facility]

## 2016-11-30 ENCOUNTER — Encounter (HOSPITAL_COMMUNITY): Payer: Self-pay

## 2016-11-30 MED ORDER — IBUPROFEN 600 MG PO TABS: 600.0000 mg | ORAL_TABLET | Freq: Four times a day (QID) | ORAL | 0 refills | Status: DC

## 2016-11-30 NOTE — Discharge Summary (Signed)
OB Discharge Summary     Patient Name: COTY STUDENT DOB: January 16, 1991 MRN: [id number]  Date of admission: 11/28/2016 Delivering MD: Renne Musca   Date of discharge: 11/30/2016  Admitting diagnosis: INDUCTION Intrauterine pregnancy: [redacted]w[redacted]d     Secondary diagnosis:  Active Problems:   Maternal morbid obesity, antepartum (HCC)   Chronic hypertension in pregnancy   Unwanted fertility  Additional problems: none     Discharge diagnosis: Term Pregnancy Delivered                                                                                                Post partum procedures:none  Augmentation: Pitocin  Complications: None  Hospital course:  Onset of Labor With Vaginal Delivery     26 y.o. yo G2P1001 at [redacted]w[redacted]d was admitted in Latent Labor on 11/28/2016. Patient had an uncomplicated labor course as follows:  Membrane Rupture Time/Date: 10:28 PM ,11/28/2016   Intrapartum Procedures: Episiotomy: None [1]                                         Lacerations:  None [1]  Patient had a delivery of a Viable infant. 11/28/2016  Information for the patient's newborn:  Roux, Brandy [[id number]]  Delivery Method: Vaginal, Spontaneous Delivery (Filed from Delivery Summary)    Pateint had an uncomplicated postpartum course.  She is ambulating, tolerating a regular diet, passing flatus, and urinating well. Patient is discharged home in stable condition on 11/30/16.   Physical exam  Vitals:   11/29/16 0500 11/29/16 0757 11/29/16 1808 11/30/16 0605  BP: 125/65 (!) 112/55 123/64 (!) 144/71  Pulse: (!) 59 60 65 (!) 50  Resp: Temp: 98.8 F (37.1 C) 98.2 F (36.8 C) 98.4 F (36.9 C) 97.8 F (36.6 C)  TempSrc: Oral Oral Oral Oral  Weight:      Height:       General: alert, cooperative and no distress Lochia: appropriate Uterine Fundus: firm Incision: N/A DVT Evaluation: No evidence of DVT seen on physical exam. Labs: Lab Results  Component Value Date   WBC  21.5 (H) 11/29/2016   HGB 9.5 (L) 11/29/2016   HCT 28.6 (L) 11/29/2016   MCV 80.3 11/29/2016   PLT 247 11/29/2016   CMP Latest Ref Rng & Units 11/29/2016  Glucose 65 - 99 mg/dL -  BUN 6 - 20 mg/dL -  Creatinine 7.82 - 9.56 mg/dL 2.13  Sodium 086 - 578 mmol/L -  Potassium 3.5 - 5.2 mmol/L -  Chloride 96 - 106 mmol/L -  CO2 20 - 29 mmol/L -  Calcium 8.7 - 10.2 mg/dL -  Total Protein 6.0 - 8.5 g/dL -  Total Bilirubin 0.0 - 1.2 mg/dL -  Alkaline Phos 39 - 469 IU/L -  AST 0 - 40 IU/L -  ALT 0 - 32 IU/L -    Discharge instruction: per After Visit Summary and "Baby and Me Booklet".  After visit meds:  Allergies as of 11/30/2016  No Known Allergies     Medication List    STOP taking these medications   butalbital-acetaminophen-caffeine 50-325-40 MG tablet Commonly known as:  FIORICET, ESGIC   Doxylamine-Pyridoxine 10-10 MG Tbec Commonly known as:  DICLEGIS   labetalol 200 MG tablet Commonly known as:  NORMODYNE   PRENATE PIXIE 10-0.6-0.4-200 MG Caps     TAKE these medications   aspirin 81 MG chewable tablet Chew 1 tablet (81 mg total) by mouth daily.   ibuprofen 600 MG tablet Commonly known as:  ADVIL,MOTRIN Take 1 tablet (600 mg total) by mouth every 6 (six) hours.       Diet: carb modified diet  Activity: Advance as tolerated. Pelvic rest for 6 weeks.   Outpatient follow up:6 weeks Follow up Appt:Future Appointments Date Time Provider Department Center  12/26/2016 9:30 AM Conan Bowens, MD [facility] None   Follow up Visit:No Follow-up on file.  Postpartum contraception: Depo Provera for now, scheduled for laparoscopic BTS and LEEP on 01/08/17 with Dr. Macon Large. Patient is aware.  Newborn Data: Live born female  Birth Weight: 7 lb 9.3 oz (3440 g) APGAR: 8, 9  Newborn Delivery   Birth date/time:  11/28/2016 23:40:00 Delivery type:  Vaginal, Spontaneous Delivery      Baby Feeding: Bottle Disposition:home with mother   11/30/2016 Arlyce Harman,  DO    Attestation of Attending Supervision of Resident: Evaluation and management procedures were performed by the [facility] Resident under my supervision.  I confirm that I have verified the information documented in the resident's note, and that I have also personally reperformed the physical exam and all medical decision making activities.  Surgery dates discussed with patient. She will have postpartum appointment/preop appointment 2 weeks prior to surgery. All questions answered.     Jaynie Collins, MD, FACOG Attending Obstetrician & Gynecologist Faculty Practice, [facility]

## 2016-11-30 NOTE — Discharge Instructions (Signed)

## 2016-11-30 NOTE — Progress Notes (Signed)
Post Partum Day 2 Subjective: no complaints, up ad lib, voiding and + flatus  Objective: Blood pressure (!) 144/71, pulse (!) 50, temperature 97.8 F (36.6 C), temperature source Oral, resp. rate 18, height  (1.753 m), weight (!) 315 lb (142.9 kg), last menstrual period 02/29/2016.  Physical Exam:  General: alert, cooperative and no distress Lochia: appropriate Uterine Fundus: firm Incision: na DVT Evaluation: No evidence of DVT seen on physical exam. No cords or calf tenderness.   Recent Labs  11/28/16 2037 11/29/16 0523  HGB 9.8* 9.5*  HCT 30.3* 28.6*    Assessment/Plan: Discharge home, Breastfeeding and Contraception depo, then BTL   LOS: 2 days   Arlyce Harman 11/30/2016, 9:09 AM

## 2016-12-26 ENCOUNTER — Ambulatory Visit (INDEPENDENT_AMBULATORY_CARE_PROVIDER_SITE_OTHER): Payer: Medicaid Other | Admitting: Obstetrics and Gynecology

## 2016-12-26 DIAGNOSIS — O0990 Supervision of high risk pregnancy, unspecified, unspecified trimester: Secondary | ICD-10-CM

## 2016-12-26 DIAGNOSIS — R87613 High grade squamous intraepithelial lesion on cytologic smear of cervix (HGSIL): Secondary | ICD-10-CM

## 2016-12-26 DIAGNOSIS — O10919 Unspecified pre-existing hypertension complicating pregnancy, unspecified trimester: Secondary | ICD-10-CM

## 2016-12-26 NOTE — Progress Notes (Signed)
Post Partum Exam  Carly Hopkins is a 26 y.o. 362P1001 female who presents for a postpartum visit. She is 4 weeks postpartum following a spontaneous vaginal delivery. I have fully reviewed the prenatal and intrapartum course. The delivery was at 39 gestational weeks.  Anesthesia: epidural. Postpartum course has been doing well. Baby's course has been doing well. Baby is feeding by bottle - [employer]-Gent[employer]. Bleeding no bleeding. Bowel function is normal. Bladder function is normal. Patient is not sexually active. Contraception method is abstinence. [facility]  Pt has BTL scheduled for 01/08/17. Postpartum depression screening:neg, score 0.  The following portions of the patient's history were reviewed and updated as appropriate: allergies, current medications, past family history, past medical history, past social history, past surgical history and problem list.  Review of Systems  Pertinent items are noted in HPI.    Objective:  unknown if currently breastfeeding.  General:  alert, cooperative, appears stated age and mildly obese   Breasts:  deferred  Lungs: Normal respiratory effort  Heart:  Normal rate and rhythm  Abdomen: Soft, non-tender       Assessment:    Normal postpartum exam. No complaints and overall doing very well.   Plan:   Routine post partum care cHTN with no meds, normal BP today, cont to monitor and should f/u with PCP Patient scheduled for laparoscopic BTL and LEEP for 01/08/17 with Dr. Lovett SoxAnywanwu. Verbalizes understanding of instructions. Will call with any issues.   Return for annual or prn  K. Therese SarahMeryl Davis, M.D. Attending Obstetrician & Gynecologist, [facility] for [facility], [facility]

## 2016-12-27 NOTE — Patient Instructions (Addendum)
Your procedure is scheduled on:   Tuesday, Nov 13  [facility] at:  1:45 pm  Pick up the phone at the desk and dial [phone].  Call this number if you have problems the morning of surgery: [phone].  Remember: Do NOT eat food after midnight Monday  Do NOT drink clear liquids (including water) after: 9 am day of surgery.  Take these medicines the morning of surgery with a SIP OF WATER:  None  Do NOT wear jewelry (body piercing), metal hair clips/bobby pins, make-up, or nail polish. Do NOT wear lotions, powders, or perfumes.  You may wear deoderant. Do NOT shave for 48 hours prior to surgery. Do NOT bring valuables to the hospital.   Have a responsible adult drive you home and stay with you for 24 hours after your procedure.  Home with Mother Donnella ShamLaDonna cell [phone].

## 2016-12-31 ENCOUNTER — Encounter (HOSPITAL_COMMUNITY): Payer: Self-pay | Admitting: *Deleted

## 2016-12-31 ENCOUNTER — Other Ambulatory Visit: Payer: Self-pay

## 2016-12-31 ENCOUNTER — Encounter (HOSPITAL_COMMUNITY)
Admission: RE | Admit: 2016-12-31 | Discharge: 2016-12-31 | Disposition: A | Discharge status: Home / Self Care | Payer: Medicaid Other | Source: Ambulatory Visit | Attending: Obstetrics & Gynecology | Admitting: Obstetrics & Gynecology

## 2016-12-31 DIAGNOSIS — D069 Carcinoma in situ of cervix, unspecified: Secondary | ICD-10-CM | POA: Insufficient documentation

## 2016-12-31 DIAGNOSIS — Z01812 Encounter for preprocedural laboratory examination: Secondary | ICD-10-CM | POA: Insufficient documentation

## 2016-12-31 HISTORY — DX: Anemia, unspecified: D64.9

## 2016-12-31 LAB — CBC
HEMATOCRIT: 34.9 % — AB (ref 36.0–46.0)
HEMOGLOBIN: 11.1 g/dL — AB (ref 12.0–15.0)
MCH: 25.5 pg — AB (ref 26.0–34.0)
MCHC: 31.8 g/dL (ref 30.0–36.0)
MCV: 80 fL (ref 78.0–100.0)
Platelets: 352 10*3/uL (ref 150–400)
RBC: 4.36 MIL/uL (ref 3.87–5.11)
RDW: 16 % — ABNORMAL HIGH (ref 11.5–15.5)
WBC: 9.2 10*3/uL (ref 4.0–10.5)

## 2017-01-08 ENCOUNTER — Encounter (HOSPITAL_COMMUNITY): Payer: Self-pay | Admitting: Anesthesiology

## 2017-01-08 ENCOUNTER — Encounter (HOSPITAL_COMMUNITY): Payer: Self-pay

## 2017-01-08 ENCOUNTER — Other Ambulatory Visit: Payer: Self-pay

## 2017-01-08 ENCOUNTER — Ambulatory Visit (HOSPITAL_COMMUNITY): Payer: Medicaid Other | Admitting: Anesthesiology

## 2017-01-08 ENCOUNTER — Encounter (HOSPITAL_COMMUNITY)
Admission: AD | Disposition: A | Discharge status: Home / Self Care | Payer: Self-pay | Source: Ambulatory Visit | Attending: Obstetrics & Gynecology

## 2017-01-08 ENCOUNTER — Ambulatory Visit (HOSPITAL_COMMUNITY)
Admission: AD | Admit: 2017-01-08 | Discharge: 2017-01-08 | Disposition: A | Discharge status: Home / Self Care | Payer: Medicaid Other | Source: Ambulatory Visit | Attending: Obstetrics & Gynecology | Admitting: Obstetrics & Gynecology

## 2017-01-08 DIAGNOSIS — D069 Carcinoma in situ of cervix, unspecified: Secondary | ICD-10-CM | POA: Diagnosis present

## 2017-01-08 DIAGNOSIS — I10 Essential (primary) hypertension: Secondary | ICD-10-CM | POA: Diagnosis not present

## 2017-01-08 DIAGNOSIS — Z302 Encounter for sterilization: Secondary | ICD-10-CM | POA: Diagnosis present

## 2017-01-08 DIAGNOSIS — Z3009 Encounter for other general counseling and advice on contraception: Secondary | ICD-10-CM

## 2017-01-08 DIAGNOSIS — Z79899 Other long term (current) drug therapy: Secondary | ICD-10-CM | POA: Diagnosis not present

## 2017-01-08 DIAGNOSIS — Z7982 Long term (current) use of aspirin: Secondary | ICD-10-CM | POA: Diagnosis not present

## 2017-01-08 HISTORY — DX: Carcinoma in situ of cervix, unspecified: D06.9

## 2017-01-08 HISTORY — DX: Morbid (severe) obesity due to excess calories: E66.01

## 2017-01-08 HISTORY — PX: LEEP: SHX91

## 2017-01-08 HISTORY — PX: LAPAROSCOPIC TUBAL LIGATION: SHX1937

## 2017-01-08 LAB — PREGNANCY, URINE: Preg Test, Ur: NEGATIVE

## 2017-01-08 SURGERY — LIGATION, FALLOPIAN TUBE, LAPAROSCOPIC
Anesthesia: General | Site: Vagina

## 2017-01-08 MED ORDER — BUPIVACAINE HCL (PF) 0.5 % IJ SOLN
INTRAMUSCULAR | Status: AC
  Filled 2017-01-08: qty 30

## 2017-01-08 MED ORDER — MIDAZOLAM HCL 2 MG/2ML IJ SOLN
INTRAMUSCULAR | Status: AC
  Filled 2017-01-08: qty 2

## 2017-01-08 MED ORDER — DEXTROSE 5 % IV SOLN
INTRAVENOUS | Status: AC
  Filled 2017-01-08: qty 3000

## 2017-01-08 MED ORDER — ONDANSETRON HCL 4 MG/2ML IJ SOLN
INTRAMUSCULAR | Status: DC | PRN
  Administered 2017-01-08: 4 mg via INTRAVENOUS

## 2017-01-08 MED ORDER — ROCURONIUM BROMIDE 100 MG/10ML IV SOLN
INTRAVENOUS | Status: DC | PRN
  Administered 2017-01-08: 25 mg via INTRAVENOUS

## 2017-01-08 MED ORDER — PROPOFOL 10 MG/ML IV BOLUS
INTRAVENOUS | Status: DC | PRN
  Administered 2017-01-08: 200 mg via INTRAVENOUS

## 2017-01-08 MED ORDER — SUGAMMADEX SODIUM 200 MG/2ML IV SOLN
INTRAVENOUS | Status: DC | PRN
  Administered 2017-01-08: 300 mg via INTRAVENOUS

## 2017-01-08 MED ORDER — BUPIVACAINE HCL (PF) 0.5 % IJ SOLN
INTRAMUSCULAR | Status: DC | PRN
  Administered 2017-01-08: 30 mL

## 2017-01-08 MED ORDER — DEXAMETHASONE SODIUM PHOSPHATE 4 MG/ML IJ SOLN
INTRAMUSCULAR | Status: DC | PRN
  Administered 2017-01-08: 10 mg via INTRAVENOUS

## 2017-01-08 MED ORDER — DOCUSATE SODIUM 100 MG PO CAPS: 100.0000 mg | ORAL_CAPSULE | Freq: Two times a day (BID) | ORAL | 2 refills | Status: DC | PRN

## 2017-01-08 MED ORDER — OXYCODONE HCL 5 MG/5ML PO SOLN: 5.0000 mg | Freq: Once | ORAL | Status: DC | PRN

## 2017-01-08 MED ORDER — MIDAZOLAM HCL 2 MG/2ML IJ SOLN
INTRAMUSCULAR | Status: DC | PRN
  Administered 2017-01-08: 2 mg via INTRAVENOUS

## 2017-01-08 MED ORDER — SUCCINYLCHOLINE CHLORIDE 200 MG/10ML IV SOSY
PREFILLED_SYRINGE | INTRAVENOUS | Status: AC
  Filled 2017-01-08: qty 10

## 2017-01-08 MED ORDER — PROMETHAZINE HCL 25 MG/ML IJ SOLN: 6.2500 mg | INTRAMUSCULAR | Status: DC | PRN

## 2017-01-08 MED ORDER — HYDROMORPHONE HCL 1 MG/ML IJ SOLN
INTRAMUSCULAR | Status: AC
  Filled 2017-01-08: qty 1

## 2017-01-08 MED ORDER — PROPOFOL 10 MG/ML IV BOLUS
INTRAVENOUS | Status: AC
  Filled 2017-01-08: qty 20

## 2017-01-08 MED ORDER — MEPERIDINE HCL 25 MG/ML IJ SOLN: 6.2500 mg | INTRAMUSCULAR | Status: DC | PRN

## 2017-01-08 MED ORDER — BUPIVACAINE-EPINEPHRINE (PF) 0.5% -1:200000 IJ SOLN
INTRAMUSCULAR | Status: AC
  Filled 2017-01-08: qty 30

## 2017-01-08 MED ORDER — DEXTROSE 5 % IV SOLN
3.0000 g | INTRAVENOUS | Status: AC
  Administered 2017-01-08: 3 g via INTRAVENOUS
  Filled 2017-01-08: qty 3000

## 2017-01-08 MED ORDER — IBUPROFEN 600 MG PO TABS: 600.0000 mg | ORAL_TABLET | Freq: Four times a day (QID) | ORAL | 2 refills | Status: DC | PRN

## 2017-01-08 MED ORDER — FENTANYL CITRATE (PF) 100 MCG/2ML IJ SOLN
INTRAMUSCULAR | Status: DC | PRN
  Administered 2017-01-08: 100 ug via INTRAVENOUS
  Administered 2017-01-08 (×3): 50 ug via INTRAVENOUS

## 2017-01-08 MED ORDER — GLYCOPYRROLATE 0.2 MG/ML IJ SOLN
INTRAMUSCULAR | Status: DC | PRN
  Administered 2017-01-08: 0.1 mg via INTRAVENOUS

## 2017-01-08 MED ORDER — LACTATED RINGERS IV SOLN
INTRAVENOUS | Status: DC
  Administered 2017-01-08: 1000 mL via INTRAVENOUS

## 2017-01-08 MED ORDER — HYDROMORPHONE HCL 1 MG/ML IJ SOLN
0.2500 mg | INTRAMUSCULAR | Status: DC | PRN
  Administered 2017-01-08 (×2): 0.5 mg via INTRAVENOUS

## 2017-01-08 MED ORDER — SUGAMMADEX SODIUM 200 MG/2ML IV SOLN
INTRAVENOUS | Status: AC
  Filled 2017-01-08: qty 4

## 2017-01-08 MED ORDER — HYDROMORPHONE HCL 1 MG/ML IJ SOLN
INTRAMUSCULAR | Status: DC | PRN
  Administered 2017-01-08: 1 mg via INTRAVENOUS

## 2017-01-08 MED ORDER — LIDOCAINE HCL (CARDIAC) 20 MG/ML IV SOLN
INTRAVENOUS | Status: DC | PRN
  Administered 2017-01-08 (×2): 100 mg via INTRAVENOUS

## 2017-01-08 MED ORDER — IODINE STRONG (LUGOLS) 5 % PO SOLN
ORAL | Status: AC
  Filled 2017-01-08: qty 1

## 2017-01-08 MED ORDER — DEXAMETHASONE SODIUM PHOSPHATE 10 MG/ML IJ SOLN
INTRAMUSCULAR | Status: AC
  Filled 2017-01-08: qty 1

## 2017-01-08 MED ORDER — ROCURONIUM BROMIDE 100 MG/10ML IV SOLN
INTRAVENOUS | Status: AC
  Filled 2017-01-08: qty 1

## 2017-01-08 MED ORDER — OXYCODONE-ACETAMINOPHEN 5-325 MG PO TABS: 1.0000 | ORAL_TABLET | Freq: Four times a day (QID) | ORAL | 0 refills | Status: DC | PRN

## 2017-01-08 MED ORDER — ONDANSETRON HCL 4 MG/2ML IJ SOLN
INTRAMUSCULAR | Status: AC
  Filled 2017-01-08: qty 2

## 2017-01-08 MED ORDER — OXYCODONE HCL 5 MG PO TABS: 5.0000 mg | ORAL_TABLET | Freq: Once | ORAL | Status: DC | PRN

## 2017-01-08 MED ORDER — SCOPOLAMINE 1 MG/3DAYS TD PT72
MEDICATED_PATCH | TRANSDERMAL | Status: AC
  Administered 2017-01-08: 1.5 mg via TRANSDERMAL
  Filled 2017-01-08: qty 1

## 2017-01-08 MED ORDER — ACETIC ACID 5 % SOLN
Status: AC
  Filled 2017-01-08: qty 500

## 2017-01-08 MED ORDER — LACTATED RINGERS IV SOLN
INTRAVENOUS | Status: DC
  Administered 2017-01-08: 15:00:00 via INTRAVENOUS
  Administered 2017-01-08: 125 mL/h via INTRAVENOUS

## 2017-01-08 MED ORDER — SUCCINYLCHOLINE CHLORIDE 20 MG/ML IJ SOLN
INTRAMUSCULAR | Status: DC | PRN
  Administered 2017-01-08: 120 mg via INTRAVENOUS

## 2017-01-08 MED ORDER — SCOPOLAMINE 1 MG/3DAYS TD PT72
1.0000 | MEDICATED_PATCH | Freq: Once | TRANSDERMAL | Status: DC
  Administered 2017-01-08: 1.5 mg via TRANSDERMAL

## 2017-01-08 MED ORDER — LIDOCAINE HCL (CARDIAC) 20 MG/ML IV SOLN
INTRAVENOUS | Status: AC
  Filled 2017-01-08: qty 5

## 2017-01-08 MED ORDER — FENTANYL CITRATE (PF) 250 MCG/5ML IJ SOLN
INTRAMUSCULAR | Status: AC
  Filled 2017-01-08: qty 5

## 2017-01-08 MED ORDER — FERRIC SUBSULFATE SOLN
Status: DC | PRN
Start: 2017-01-08 — End: 2017-01-08
  Administered 2017-01-08: 1 via TOPICAL

## 2017-01-08 MED ORDER — FERRIC SUBSULFATE 259 MG/GM EX SOLN
CUTANEOUS | Status: AC
  Filled 2017-01-08: qty 8

## 2017-01-08 SURGICAL SUPPLY — 39 items
ADH SKN CLS APL DERMABOND .7 (GAUZE/BANDAGES/DRESSINGS) ×2
APPLICATOR COTTON TIP 6IN STRL (MISCELLANEOUS) ×4 IMPLANT
CATH ROBINSON RED A/P 16FR (CATHETERS) ×4 IMPLANT
CLIP FILSHIE TUBAL LIGA STRL (Clip) ×4 IMPLANT
DERMABOND ADVANCED (GAUZE/BANDAGES/DRESSINGS) ×2
DERMABOND ADVANCED .7 DNX12 (GAUZE/BANDAGES/DRESSINGS) ×2 IMPLANT
DRSG OPSITE POSTOP 3X4 (GAUZE/BANDAGES/DRESSINGS) ×4 IMPLANT
DURAPREP 26ML APPLICATOR (WOUND CARE) ×4 IMPLANT
ELECT BALL LEEP 5MM RED (ELECTRODE) ×2 IMPLANT
ELECT LOOP LEEP RND 15X12 GRN (CUTTING LOOP)
ELECT LOOP LEEP RND 20X12 WHT (CUTTING LOOP)
ELECT REM PT RETURN 9FT ADLT (ELECTROSURGICAL) ×4
ELECTRODE LOOP LP RND 15X12GRN (CUTTING LOOP) IMPLANT
ELECTRODE LOOP LP RND 20X12WHT (CUTTING LOOP) IMPLANT
ELECTRODE REM PT RTRN 9FT ADLT (ELECTROSURGICAL) ×2 IMPLANT
EXCISOR BIOPSY CONE FISHER (MISCELLANEOUS) ×2 IMPLANT
EXTENDER ELECT LOOP LEEP 10CM (CUTTING LOOP) IMPLANT
GLOVE BIOGEL PI IND STRL 7.0 (GLOVE) ×8 IMPLANT
GLOVE BIOGEL PI INDICATOR 7.0 (GLOVE) ×8
GLOVE ECLIPSE 7.0 STRL STRAW (GLOVE) ×4 IMPLANT
GOWN STRL REUS W/TWL LRG LVL3 (GOWN DISPOSABLE) ×8 IMPLANT
NEEDLE INSUFFLATION 120MM (ENDOMECHANICALS) IMPLANT
NEEDLE INSUFFLATION 150MM (ENDOMECHANICALS) ×2 IMPLANT
NS IRRIG 1000ML POUR BTL (IV SOLUTION) ×4 IMPLANT
PACK LAPAROSCOPY BASIN (CUSTOM PROCEDURE TRAY) ×4 IMPLANT
PACK TRENDGUARD 600 HYBRD PROC (MISCELLANEOUS) IMPLANT
PACK VAGINAL MINOR WOMEN LF (CUSTOM PROCEDURE TRAY) ×4 IMPLANT
PAD OB MATERNITY 4.3X12.25 (PERSONAL CARE ITEMS) ×4 IMPLANT
PENCIL SMOKE EVAC W/HOLSTER (ELECTROSURGICAL) ×4 IMPLANT
PROTECTOR NERVE ULNAR (MISCELLANEOUS) ×8 IMPLANT
SCOPETTES 8  STERILE (MISCELLANEOUS) ×4
SCOPETTES 8 STERILE (MISCELLANEOUS) ×4 IMPLANT
SUT VIC AB 3-0 X1 27 (SUTURE) ×4 IMPLANT
SUT VICRYL 0 UR6 27IN ABS (SUTURE) ×4 IMPLANT
SYR 30ML LL (SYRINGE) ×4 IMPLANT
TOWEL OR 17X24 6PK STRL BLUE (TOWEL DISPOSABLE) ×8 IMPLANT
TRENDGUARD 600 HYBRID PROC PK (MISCELLANEOUS) ×4
TROCAR 12M 150ML BLUNT (TROCAR) ×2 IMPLANT
TROCAR XCEL NON-BLD 11X100MML (ENDOMECHANICALS) ×4 IMPLANT

## 2017-01-08 NOTE — Anesthesia Postprocedure Evaluation (Signed)
Anesthesia Post Note  Patient: Carly FothergillShanice N Heaslip  Procedure(s) Performed: LAPAROSCOPIC TUBAL LIGATION (Bilateral Abdomen) LOOP ELECTROSURGICAL EXCISION PROCEDURE (LEEP) (N/A Vagina )     Anesthesia Post Evaluation  Last Vitals:  Vitals:   01/08/17 1715 01/08/17 1730  BP:    Pulse: 75 73  Resp: 16 13  Temp:  37 C  SpO2: 100% 100%    Last Pain:  Vitals:   01/08/17 1730  TempSrc:   PainSc: 0-No pain   Pain Goal: Patients Stated Pain Goal: 4 (01/08/17 1730)               Lowella CurbWarren Ray Miller

## 2017-01-08 NOTE — Op Note (Signed)
Carly Hopkins 01/08/2017  PREOPERATIVE DIAGNOSES:  Undesired fertility; CIN III  POSTOPERATIVE DIAGNOSES:  Undesired fertility  PROCEDURE:  Laparoscopic Bilateral Tubal Sterilization using Filshie Clips and Loop electrosurgical excision procedure (LEEP)  SURGEON: Jaynie CollinsUgonna Dajha Urquilla, MD  ANESTHESIA:  General endotracheal  COMPLICATIONS:  None immediate.  ESTIMATED BLOOD LOSS:  150 ml.  SPECIMENS: Cervical biopsy/LEEP specimens sent to pathology  INDICATIONS: 26 y.o. G2P1001 with undesired fertility who desires permanent sterilization. Other reversible forms of contraception were discussed with patient; she declines all other modalities.  Risks of procedure discussed with patient including permanence of method, bleeding, infection, injury to surrounding organs and need for additional procedures including laparotomy, risk of regret.  Failure risk of 0.5-1% with increased risk of ectopic gestation if pregnancy occurs was also discussed with patient.  Patient also has CIN III, noted on colposcopy during recent pregnancy and needs a LEEP.     FINDINGS:  Normal uterus, tubes, and ovaries.  TECHNIQUE:  The patient was taken to the operating room where general anesthesia was obtained without difficulty.  She was then placed in the dorsal lithotomy position and prepared and draped in sterile fashion.  After an adequate timeout was performed, a bivalved speculum was then placed in the patient's vagina, and the anterior lip of cervix grasped with the single-tooth tenaculum.  The uterine manipulator was then advanced into the uterus.  The speculum was removed from the vagina.  Attention was then turned to the patient's abdomen where a 11-mm skin incision was made in the umbilical fold.  The Optiview 11-mm trocar, sleeve and laparoscope were then advanced under direct visualization, with significant difficulty due to habitus  into the abdomen.  The abdomen was then insufflated with carbon dioxide gas and  adequate pneumoperitoneum was obtained.  A survey of the patient's pelvis and abdomen revealed entirely normal anatomy.  The fallopian tubes were observed and found to be normal in appearance. The Filshie clip applicator was placed through the operative port, and a Filshie clip was placed on the right fallopian tube ,about 2 cm from the cornual attachment, with care given to incorporate the underlying mesosalpinx.  A similar process was carried out on the contralateral side allowing for bilateral tubal sterilization.   Good hemostasis was noted overall.  The instruments were then removed from the patient's abdomen and the fascial incision was repaired with 0 Vicryl, and the skin was closed with Dermabond.  The uterine manipulator and the tenaculum were removed from the vagina without complications.   Attention was then turned to the pelvis. A bivalved coated speculum was placed in the patient's vagina.  Lugol's solution was applied to the cervix and areas of decreased uptake were noted around the transformation zone.   Local anesthesia was administered via a paracervical block using 20 ml of 1% Lidocaine. The Large 1X Fisher Cone Biopsy Excisor on [address]60 Watts of blended current was used to excise the area of decreased uptake and excise the entire transformation zone.  There was significant bleeding noted especially from high in the endocervical canal.  Good hemostasis was ultimately achieved using roller ball coagulation set at 60 Watts coagulation current. Monsel's solution was then applied and the speculum was removed from the vagina.  Specimens were sent to pathology.  The patient tolerated the procedures well.  Sponge, lap, and needle counts were correct times two.  The patient was then taken to the recovery room awake, extubated and in stable condition. She will be discharged to home as per  PACU criteria.  Routine postoperative instructions given.  She was prescribed Percocet, Ibuprofen and Colace.  She  will follow up in the office in 4 weeks for postoperative evaluation.   Jaynie CollinsUGONNA  Andrian Sabala, MD, FACOG Attending Obstetrician & Gynecologist, [facility] for [facility], [facility]

## 2017-01-08 NOTE — Anesthesia Procedure Notes (Signed)
Procedure Name: Intubation Date/Time: 01/08/2017 3:14 PM Performed by: Flossie Dibble, CRNA Pre-anesthesia Checklist: Patient identified, Patient being monitored, Timeout performed, Emergency Drugs available and Suction available Patient Re-evaluated:Patient Re-evaluated prior to induction Oxygen Delivery Method: Circle System Utilized and Circle system utilized Preoxygenation: Pre-oxygenation with 100% oxygen Induction Type: IV induction Ventilation: Mask ventilation without difficulty Laryngoscope Size: Mac and 3 Grade View: Grade II Tube type: Oral Tube size: 7.0 mm Number of attempts: 1 Airway Equipment and Method: stylet and Stylet Placement Confirmation: ETT inserted through vocal cords under direct vision,  positive ETCO2 and breath sounds checked- equal and bilateral Secured at: 21 cm Tube secured with: Tape Dental Injury: Teeth and Oropharynx as per pre-operative assessment

## 2017-01-08 NOTE — H&P (Signed)
Preoperative History and Physical  Carly Hopkins is a 26 y.o. G2P1001 here for surgical management of undesired fertility and CIN III.   No significant preoperative concerns.  Proposed surgical procedures:  Laparoscopic bilateral tubal sterilization using Filshie clips and Loop electrosurgical excision procedure (LEEP)  Past Medical History:  Diagnosis Date  . Anemia   . 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 Hypertension    Resolved - only with pregnancy  . Morbid obesity (HCC) 01/08/2017  . Severe dysplasia of cervix (CIN III)    After HGSIL pap  . SVD (spontaneous vaginal delivery) 2011, 11/2016   x 2   Past Surgical History:  Procedure Laterality Date  . NO PAST SURGERIES     OB History  Gravida Para Term Preterm AB Living  2 1 1     1   SAB TAB Ectopic Multiple Live Births          1    # Outcome Date GA Lbr Len/2nd Weight Sex Delivery Anes PTL Lv  2 Gravida           1 Term 10/09/09 5147w2d  6 lb 8 oz (2.948 kg) M Vag-Spont EPI N LIV    Patient denies any other pertinent gynecologic issues.   No current facility-administered medications on file prior to encounter.    Current Outpatient Medications on File Prior to Encounter  Medication Sig Dispense Refill  . aspirin 81 MG chewable tablet Chew 1 tablet (81 mg total) by mouth daily. (Patient not taking: Reported on 12/21/2016) 30 tablet 12  . ibuprofen (ADVIL,MOTRIN) 600 MG tablet Take 1 tablet (600 mg total) by mouth every 6 (six) hours. (Patient not taking: Reported on 12/21/2016) 30 tablet 0   No Known Allergies  Social History:   reports that  has never smoked. she has never used smokeless tobacco. She reports that she drinks alcohol. She reports that she does not use drugs.  Family History  Problem Relation Age of Onset  . Hypertension Mother   . Diabetes Mother   . Diabetes Maternal Grandmother   . Hypertension Maternal Grandmother   . Hypertension Maternal Grandfather   . Diabetes Maternal  Grandfather     Review of Systems: Pertinent items noted in HPI and remainder of comprehensive ROS otherwise negative.  PHYSICAL EXAM: Blood pressure 124/76, pulse 79, temperature 98.1 F (36.7 C), temperature source Oral, resp. rate 16, SpO2 100 %. CONSTITUTIONAL: Well-developed, well-nourished female in no acute distress.  HENT:  Normocephalic, atraumatic, External right and left ear normal. Oropharynx is clear and moist EYES: Conjunctivae and EOM are normal. Pupils are equal, round, and reactive to light. No scleral icterus.  NECK: Normal range of motion, supple, no masses SKIN: Skin is warm and dry. No rash noted. Not diaphoretic. No erythema. No pallor. NEUROLOGIC: Alert and oriented to person, place, and time. Normal reflexes, muscle tone coordination. No cranial nerve deficit noted. PSYCHIATRIC: Normal mood and affect. Normal behavior. Normal judgment and thought content. CARDIOVASCULAR: Normal heart rate noted, regular rhythm RESPIRATORY: Effort and breath sounds normal, no problems with respiration noted ABDOMEN: Soft, nontender, nondistended. PELVIC: Deferred MUSCULOSKELETAL: Normal range of motion. No edema and no tenderness. 2+ distal pulses.  Labs: Results for orders placed or performed during the hospital encounter of 01/08/17 (from the past 336 hour(s))  Pregnancy, urine   Collection Time: 01/08/17 12:45 PM  Result Value Ref Range   Preg Test, Ur NEGATIVE NEGATIVE  Results for orders placed or performed during the hospital  encounter of 12/31/16 (from the past 336 hour(s))  CBC   Collection Time: 12/31/16  9:54 AM  Result Value Ref Range   WBC 9.2 4.0 - 10.5 K/uL   RBC 4.36 3.87 - 5.11 MIL/uL   Hemoglobin 11.1 (L) 12.0 - 15.0 g/dL   HCT 96.034.9 (L) 45.436.0 - 09.846.0 %   MCV 80.0 78.0 - 100.0 fL   MCH 25.5 (L) 26.0 - 34.0 pg   MCHC 31.8 30.0 - 36.0 g/dL   RDW 11.916.0 (H) 14.711.5 - 82.915.5 %   Platelets 352 150 - 400 K/uL    Imaging Studies: No results  found.  Assessment: Patient Active Problem List   Diagnosis Date Noted  . Morbid obesity (HCC) 01/08/2017  . Unwanted fertility 11/28/2016  . Severe dysplasia of cervix (CIN III) 06/07/2016    Plan: Patient will undergo surgical management with Laparoscopic bilateral tubal sterilization using Filshie clips and Loop electrosurgical excision procedure (LEEP).   The risks of surgery were discussed in detail with the patient including but not limited to: bleeding; infection which may require antibiotics; injury to surrounding organs; surgical site problems and other postoperative/anesthesia complications. For the BTS, risk of regret, permanence of method, failure risk of 1% with increased risk of ectopic gestation if pregnancy occurs were also discussed with patient.  For the LEEP, failure to remove abnormal tissue and failure to cure dysplasia, need for repeat procedures also emphasized.  Likelihood of success in alleviating the patient's condition was discussed. Routine postoperative instructions will be reviewed with the patient and her family in detail after surgery.  The patient concurred with the proposed plan, giving informed written consent for the surgery.  Patient has been NPO since last night and she will remain NPO for procedure.  Anesthesia and OR aware.  Preoperative SCDs ordered on call to the OR.  To OR when ready.  Jaynie CollinsUGONNA  ANYANWU, M.D. 01/08/2017 2:46 PM

## 2017-01-08 NOTE — Anesthesia Preprocedure Evaluation (Signed)
Anesthesia Evaluation  Patient identified by MRN, date of birth, ID band Patient awake    Reviewed: Allergy & Precautions, H&P , NPO status , Patient's Chart, lab work & pertinent test results  History of Anesthesia Complications Negative for: history of anesthetic complications  Airway Mallampati: II  TM Distance: >3 FB Neck ROM: full    Dental no notable dental hx. (+) Teeth Intact   Pulmonary neg pulmonary ROS,    Pulmonary exam normal breath sounds clear to auscultation       Cardiovascular hypertension, Normal cardiovascular exam Rhythm:regular Rate:Normal     Neuro/Psych negative neurological ROS  negative psych ROS   GI/Hepatic negative GI ROS, Neg liver ROS,   Endo/Other  Morbid obesity  Renal/GU negative Renal ROS  negative genitourinary   Musculoskeletal   Abdominal   Peds  Hematology negative hematology ROS (+)   Anesthesia Other Findings   Reproductive/Obstetrics                             Anesthesia Physical  Anesthesia Plan  ASA: III  Anesthesia Plan: General   Post-op Pain Management:    Induction: Intravenous  PONV Risk Score and Plan: 3 and Treatment may vary due to age or medical condition, Ondansetron, Dexamethasone and Midazolam  Airway Management Planned: Oral ETT  Additional Equipment:   Intra-op Plan:   Post-operative Plan: Extubation in OR  Informed Consent: I have reviewed the patients History and Physical, chart, labs and discussed the procedure including the risks, benefits and alternatives for the proposed anesthesia with the patient or authorized representative who has indicated his/her understanding and acceptance.   Dental advisory given  Plan Discussed with: CRNA  Anesthesia Plan Comments:         Anesthesia Quick Evaluation

## 2017-01-08 NOTE — Transfer of Care (Signed)
Immediate Anesthesia Transfer of Care Note  Patient: Carly FothergillShanice N Rodelo  Procedure(s) Performed: LAPAROSCOPIC TUBAL LIGATION (Bilateral Abdomen) LOOP ELECTROSURGICAL EXCISION PROCEDURE (LEEP) (N/A Vagina )  Patient Location: PACU  Anesthesia Type:General  Level of Consciousness: awake, alert  and oriented  Airway & Oxygen Therapy: Patient Spontanous Breathing and Patient connected to nasal cannula oxygen  Post-op Assessment: Report given to RN and Post -op Vital signs reviewed and stable  Post vital signs: Reviewed and stable  Last Vitals:  Vitals:   01/08/17 1302  BP: 124/76  Pulse: 79  Resp: 16  Temp: 36.7 C  SpO2: 100%    Last Pain:  Vitals:   01/08/17 1302  TempSrc: Oral      Patients Stated Pain Goal: 4 (01/08/17 1302)  Complications: No apparent anesthesia complications

## 2017-01-08 NOTE — Discharge Instructions (Addendum)
Laparoscopic Surgery - Care After Laparoscopy is a surgical procedure. It is used to diagnose and treat diseases inside the belly(abdomen). It is usually a brief, common, and relatively simple procedure. The laparoscopeis a thin, lighted, pencil-sized instrument. It is like a telescope. It is inserted into your abdomen through a small cut (incision). Your caregiver can look at the organs inside your body through this instrument.  She can see if there is anything abnormal. Laparoscopy can be done either in a hospital or outpatient clinic. You may be given a mild sedative to help you relax before the procedure. Once in the operating room, you will be given a drug to make you sleep (general anesthesia). Laparoscopy usually lasts about 1 hour. After the procedure, you will be monitored in a recovery area until you are stable and doing well. Once you are home, it may take 3 to 7 days to fully recover.  Laparoscopy has relatively few risks. Your caregiver will discuss the risks with you before the procedure. Some problems that can occur include: RISKS AND COMPLICATIONS  Allergies to medicines. Difficulty breathing. Bleeding. Infection. Damage to other surrounding structures HOME CARE INSTRUCTIONS  Infection. Bleeding. Damage to other organs. Anesthetic side effects.  Need for additional procedures such as open procedures/laparotomy PROCEDURE Once you receive anesthesia, your surgeon inflates the abdomen with a harmless gas (carbon dioxide). This makes the organs easier to see. The laparoscope is inserted into the abdomen through a small incision. This allows your surgeon to see into the abdomen. Other small instruments are also inserted into the abdomen through other small openings. Many surgeons attach a video camera to the laparoscope to enlarge the view. During a laparoscopy, the surgeon may be looking for inflammation, infection, or cancer.  The surgeon may also need to take out certain organs or  take tissue samples (biopsies). The specimens are sent to a specialist in looking at cells and tissue samples (pathologist). The pathologist examines them under a microscope to help to diagnose or confirm a disease. AFTER THE PROCEDURE  The incisions are closed with stitches (sutures) and Dermabond. Because these incisions are small (usually less than 1/2 inch), there is usually minimal discomfort after the procedure. There may also be discomfort from the instrument placement incisions in the abdomen. You will be given pain medicine to ease any discomfort. You will rest in a recovery room for 1-2 hours until you are stable and doing well. You may have some mild discomfort in the throat. This is from the tube placed in your throat while you were sleeping. You may experience discomfort in the shoulder area from some trapped air between the liver and diaphragm. This sensation is normal and will slowly go away on its own. The recovery time is shortened as long as there are no complications. You will rest in a recovery room until stable and doing well. As long as there are no complications, you may be allowed to go home. Someone will need to drive you home and be with you for at least 24 hours once home. FINDING OUT THE RESULTS You will be called with the results of the pathology and will discuss these results with  your caregiver during your postoperative appointment. Do not assume everything is normal if you have not heard from your caregiver or the medical facility. It is important for you to follow up on all of your results. HOME CARE INSTRUCTIONS  Take all medicines as directed. Only take over-the-counter or prescription medicines for pain, discomfort,   CARE INSTRUCTIONS   Take all medicines as directed.  Only take over-the-counter or prescription medicines for pain, discomfort, or fever as directed by your caregiver.  Resume daily activities as directed.  Showers are preferred over baths.  You may resume sexual activities in 1 week or as directed.  Do not drive while taking  narcotics. SEEK MEDICAL CARE IF:  There is increasing abdominal pain.  You feel lightheaded or faint.  You have the chills.  You have an oral temperature above 102 F (38.9 C).  There is pus-like (purulent) drainage from any of the wounds.  You are unable to pass gas or have a bowel movement.  You feel sick to your stomach (nauseous) or throw up (vomit). MAKE SURE YOU:   Understand these instructions.  Will watch your condition.  Will get help right away if you are not doing well or get worse.  ExitCare Patient Information 2013 [place]     Cervical Conization, Care After This sheet gives you information about how to care for yourself after your procedure. Your doctor may also give you more specific instructions. If you have problems or questions, contact your doctor. Follow these instructions at home: Medicines  Take over-the-counter and prescription medicines only as told by your doctor.  Do not take aspirin until your doctor says it is okay.  If you take pain medicine: ? You may have constipation. To help treat this, your doctor may tell you to:  Drink enough fluid to keep your pee (urine) clear or pale yellow.  Take medicines.  Eat foods that are high in fiber. These include fresh fruits and vegetables, whole grains, bran, and beans.  Limit foods that are high in fat and sugar. These include fried foods and sweet foods. ? Do not drive or use heavy machines. General instructions  You can eat your usual diet unless your doctor tells you not to do so.  Take showers for the first week. Do not take baths, swim, or use hot tubs until your doctor says it is okay.  Do not douche, use tampons, or have sex until your doctor says it is okay (3 weeks).  For 7-14 days after your procedure, avoid: ? Being very active. ? Exercising. ? Heavy lifting.  Keep all follow-up visits as told by your doctor. This is important. Contact a doctor if:  You have a  rash.  You are dizzy or lightheaded.  You feel sick to your stomach (nauseous).  You throw up (vomit).  You have fluid from your vagina (vaginal discharge) that smells bad. Get help right away if:  There are blood clots coming from your vagina.  You have more bleeding than you would have in a normal period. For example, you soak a pad in less than 1 hour.  You have a fever.  You have more and more cramps.  You pass out (faint).  You have pain when peeing.  Your have a lot of pain.  Your pain gets worse.  Your pain does not get better when you take your medicine.  You have blood in your pee.  You throw up (vomit). Summary  After your procedure, take over-the-counter and prescription medicines only as told by your doctor.  Do not douche, use tampons, or have sex until your doctor says it is okay.  For about 7-14 days after your procedure, try not to exercise or lift heavy objects.  Get help right away if you have new symptoms, or if your symptoms  become worse. This information is not intended to replace advice given to you by your health care provider. Make sure you discuss any questions you have with your health care provider. Document Released: 11/22/2007 Document Revised: 02/15/2016 Document Reviewed: 02/15/2016 Elsevier Interactive Patient Education  2017 [employer].  Post Anesthesia Home Care Instructions  Activity: Get plenty of rest for the remainder of the day. A responsible individual must stay with you for 24 hours following the procedure.  For the next 24 hours, DO NOT: -Drive a car -Advertising copywriterperate machinery -Drink alcoholic beverages -Take any medication unless instructed by your physician -Make any legal decisions or sign important papers.  Meals: Start with liquid foods such as gelatin or soup. Progress to regular foods as tolerated. Avoid greasy, spicy, heavy foods. If nausea and/or vomiting occur, drink only clear liquids until the nausea and/or  vomiting subsides. Call your physician if vomiting continues.  Special Instructions/Symptoms: Your throat may feel dry or sore from the anesthesia or the breathing tube placed in your throat during surgery. If this causes discomfort, gargle with warm salt water. The discomfort should disappear within 24 hours.  If you had a scopolamine patch placed behind your ear for the management of post- operative nausea and/or vomiting:  1. The medication in the patch is effective for 72 hours, after which it should be removed.  Wrap patch in a tissue and discard in the trash. Wash hands thoroughly with soap and water. 2. You may remove the patch earlier than 72 hours if you experience unpleasant side effects which may include dry mouth, dizziness or visual disturbances. 3. Avoid touching the patch. Wash your hands with soap and water after contact with the patch.

## 2017-01-09 ENCOUNTER — Encounter (HOSPITAL_COMMUNITY): Payer: Self-pay | Admitting: Obstetrics & Gynecology

## 2017-02-05 ENCOUNTER — Ambulatory Visit: Payer: Medicaid Other | Admitting: Obstetrics & Gynecology

## 2017-02-21 ENCOUNTER — Ambulatory Visit: Payer: Medicaid Other | Admitting: Obstetrics & Gynecology

## 2017-04-05 ENCOUNTER — Other Ambulatory Visit: Payer: Self-pay | Admitting: Certified Nurse Midwife

## 2018-08-23 IMAGING — US US MFM OB FOLLOW-UP
1 series · 14 of 28 positions shown · non-contrast
Comparison: none

[Series 1: us mfm ob follow-up · 60 acquisitions, 14 frames shown]
[im 3/60]
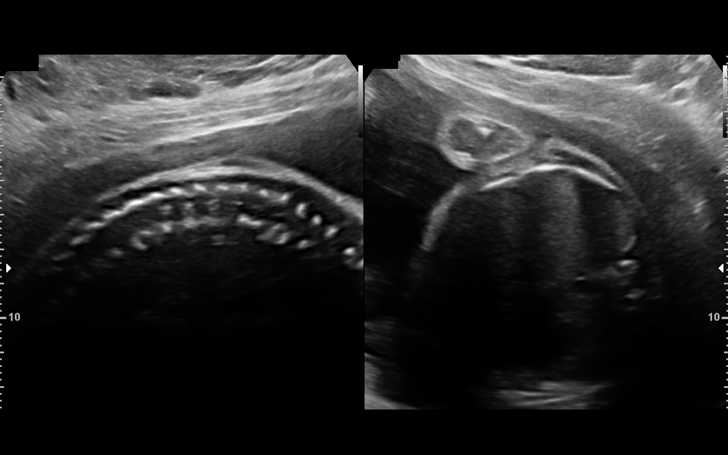
[im 7/60]
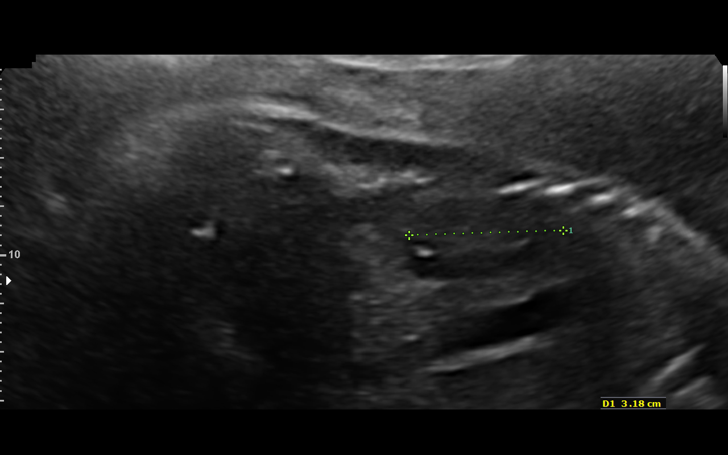
[im 11/60]
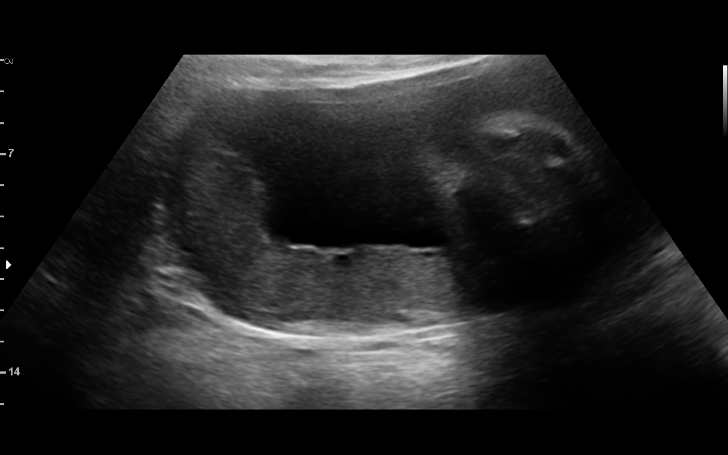
[im 16/60]
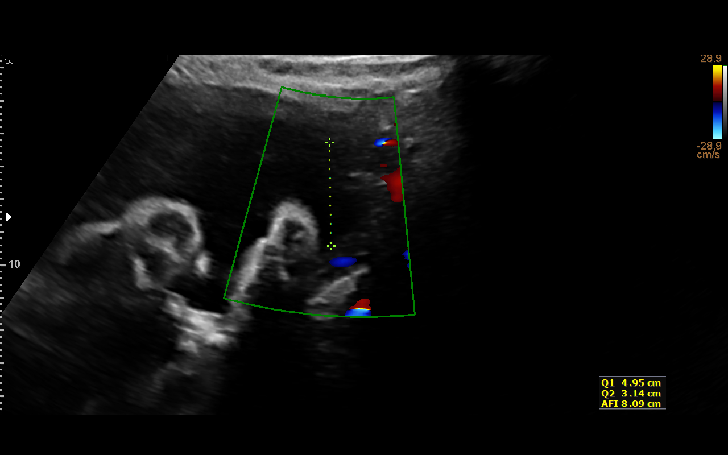
[im 20/60]
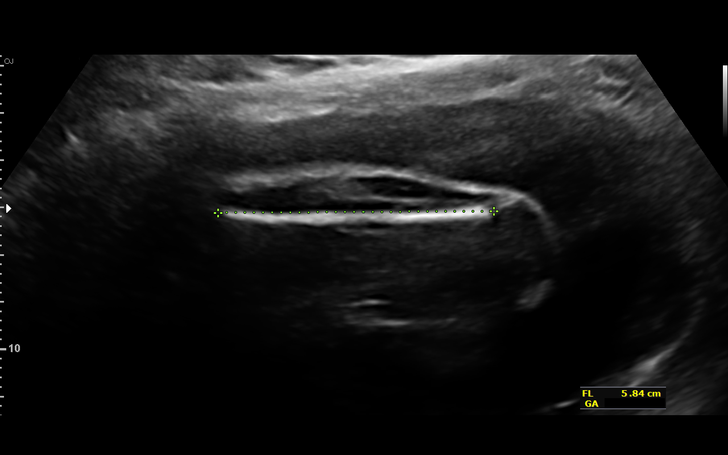
[im 25/60]
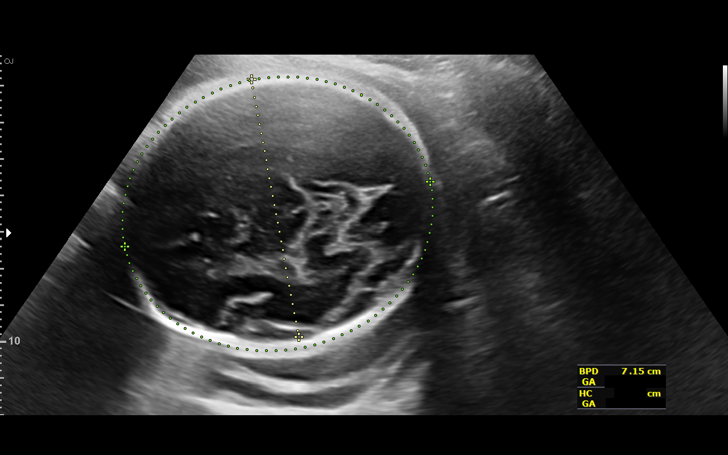
[im 29/60]
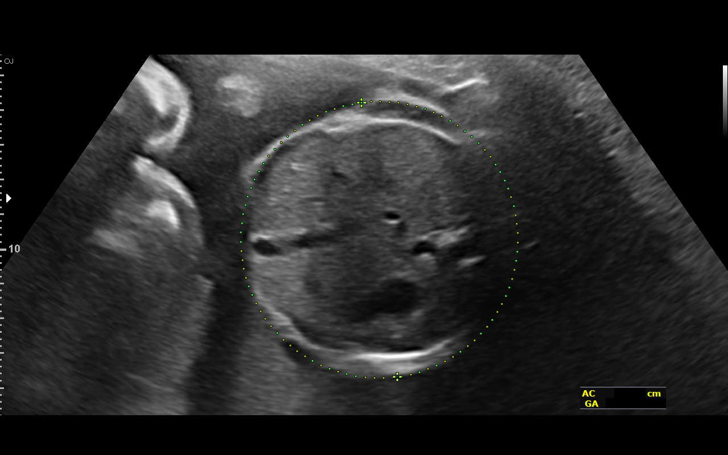
[im 33/60]
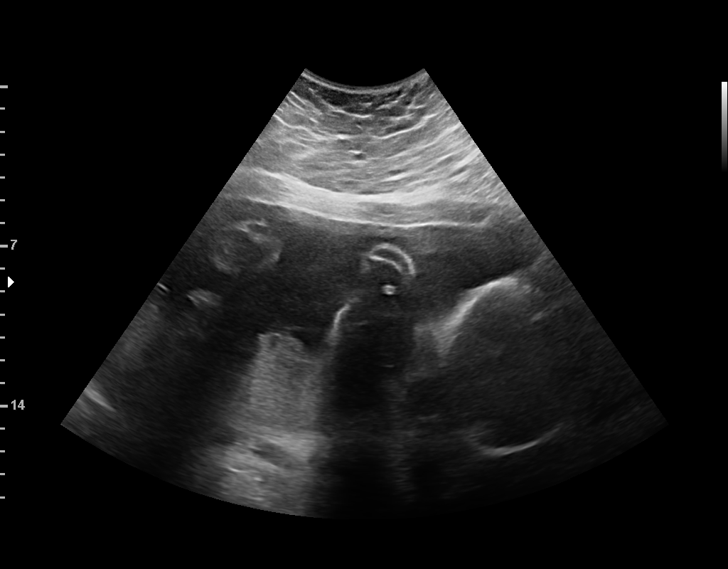
[im 38/60]
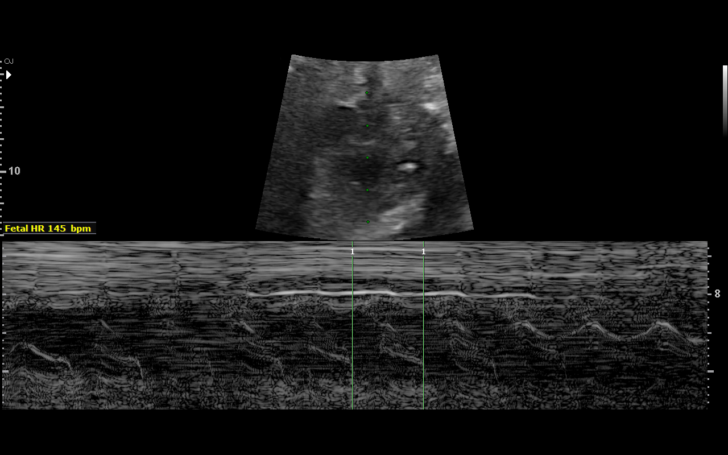
[im 42/60]
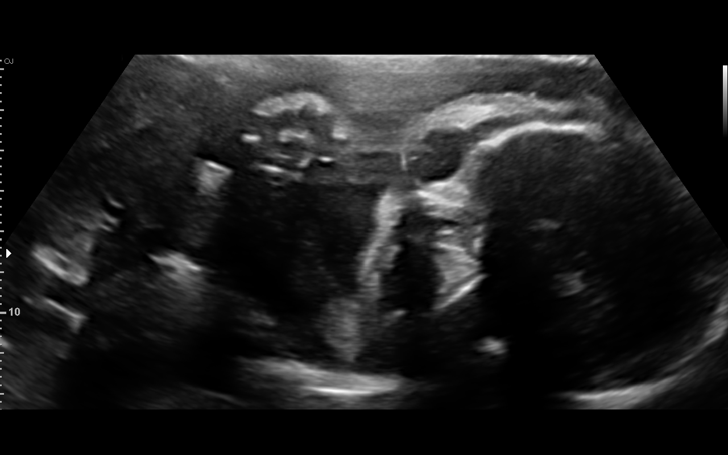
[im 46/60]
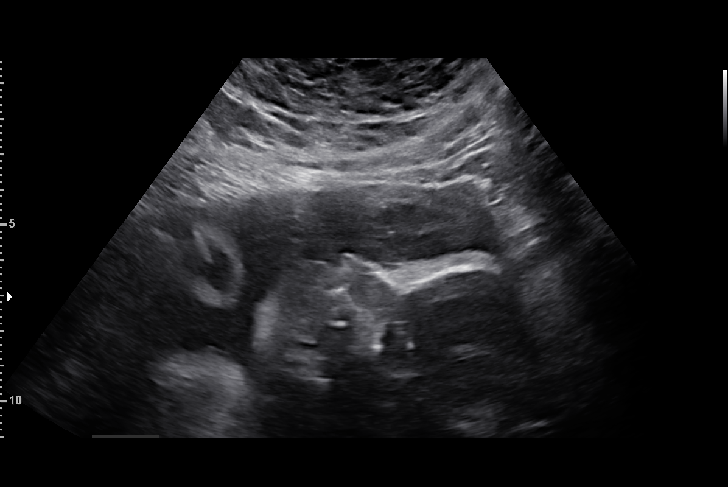
[im 51/60]
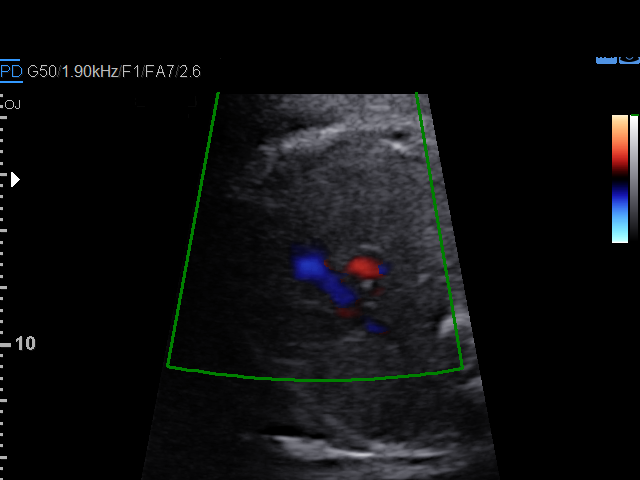
[im 55/60]
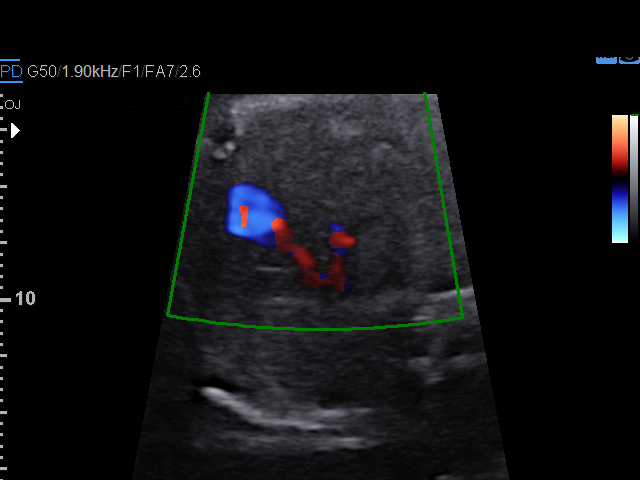
[im 60/60]
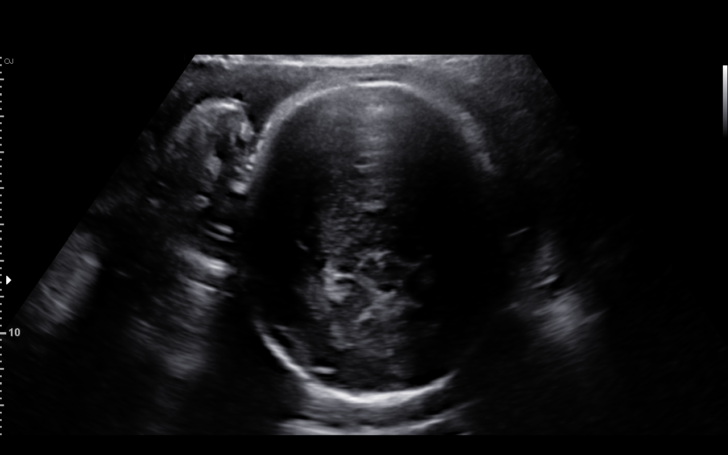

[14 of 28 positions shown; findings below may reference images not displayed]

Road [HOSPITAL]

1  SAIMEHEN LEMUS              [id number]      [id number]     [id number]
Indications

28 weeks gestation of pregnancy
Hypertension - Chronic/Pre-existing-labetalol
and ASA
Maternal morbid obesity (BMI 46.5)
Encounter for other antenatal screening
follow-up
OB History

Blood Type:            Height:  5'9"   Weight (lb):  315      BMI:
Gravidity:    2         Term:   1
Living:       1
Fetal Evaluation

Num Of Fetuses:     1
Fetal Heart         145
Rate(bpm):
Cardiac Activity:   Observed
Presentation:       Cephalic
Placenta:           Posterior, above cervical os
P. Cord Insertion:  Previously Visualized

Amniotic Fluid
AFI FV:      Subjectively within normal limits

AFI Sum(cm)     %Tile       Largest Pocket(cm)
17.1            64

RUQ(cm)       RLQ(cm)       LUQ(cm)        LLQ(cm)
4.95
Biometry

BPD:      72.6  mm     G. Age:  29w 1d         75  %    CI:        81.93   %   70 - 86
FL/HC:      23.0   %   18.8 -
HC:      253.1  mm     G. Age:  27w 4d         10  %    HC/AC:      1.00       1.05 -
AC:      252.2  mm     G. Age:  29w 3d         82  %    FL/BPD:     80.2   %   71 - 87
FL:       58.2  mm     G. Age:  30w 3d         92  %    FL/AC:      23.1   %   20 - 24
HUM:      52.5  mm     G. Age:  30w 4d       > 95  %
CER:      35.6  mm     G. Age:  30w 4d         91  %
CM:        6.8  mm

Est. FW:    1282  gm      3 lb 2 oz     78  %
Gestational Age

LMP:           28w 0d       Date:   02/29/16                 EDD:   12/05/16
U/S Today:     29w 1d                                        EDD:   11/27/16
Best:          28w 0d    Det. By:   LMP  (02/29/16)          EDD:   12/05/16
Anatomy

Cranium:               Appears normal         Aortic Arch:            Previously seen
Cavum:                 Appears normal         Ductal Arch:            Not well visualized
Ventricles:            Appears normal         Diaphragm:              Previously seen
Choroid Plexus:        Previously seen        Stomach:                Appears normal, left
sided
Cerebellum:            Appears normal         Abdomen:                Appears normal
Posterior Fossa:       Previously seen        Abdominal Wall:         Appears nml (cord
insert, abd wall)
Nuchal Fold:           Not applicable (>20    Cord Vessels:           Appears normal (3
wks GA)                                        vessel cord)
Face:                  Appears normal         Kidneys:                Appear normal
(orbits and profile)
Lips:                  Appears normal         Bladder:                Appears normal
Thoracic:              Appears normal         Spine:                  Appears normal
Heart:                 Appears normal         Upper Extremities:      Previously seen
(4CH, axis, and
situs)
RVOT:                  Appears normal         Lower Extremities:      Previously seen
LVOT:                  Previously seen

Other:  Technically difficult due to maternal habitus and fetal position. Fetus
appears to be a female previously seen.
Cervix Uterus Adnexa

Cervix
Not visualized

Uterus
No abnormality visualized.

Left Ovary
Not visualized. No adnexal mass visualized.

Right Ovary
Not visualized. No adnexal mass visualized.
Impression

SIUP at 28+0 weeks
Normal interval anatomy; anatomic survey complete except
for the DA
Normal amniotic fluid volume
Appropriate interval growth with EFW at the 78th %tile
Recommendations

Follow-up ultrasound for growth in 4 weeks

## 2019-07-22 ENCOUNTER — Other Ambulatory Visit (HOSPITAL_COMMUNITY)
Admission: RE | Admit: 2019-07-22 | Discharge: 2019-07-22 | Disposition: A | Discharge status: Home / Self Care | Payer: Medicaid Other | Source: Ambulatory Visit | Attending: Family Medicine | Admitting: Family Medicine

## 2019-07-22 ENCOUNTER — Ambulatory Visit: Payer: Medicaid Other | Admitting: Family Medicine

## 2019-07-22 ENCOUNTER — Other Ambulatory Visit: Payer: Self-pay

## 2019-07-22 ENCOUNTER — Encounter: Payer: Self-pay | Admitting: Family Medicine

## 2019-07-22 VITALS — BP 128/74 | HR 108 | Ht 69.0 in | Wt 361.2 lb

## 2019-07-22 DIAGNOSIS — Z124 Encounter for screening for malignant neoplasm of cervix: Secondary | ICD-10-CM

## 2019-07-22 NOTE — Progress Notes (Signed)
New Patient Office Visit  Subjective:  Patient ID: Carly Hopkins, female    DOB: April 12, 1990  Age: 29 y.o. MRN: [id number]  CC:  Chief Complaint  Patient presents with  . Establish Care    HPI Carly Hopkins presents to establish care.  Requests Pap smear.  Past Medical History:  Diagnosis Date  . Anemia   . 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 Hypertension    Resolved - only with pregnancy  . Morbid obesity (HCC) 01/08/2017  . Severe dysplasia of cervix (CIN III)    After HGSIL pap  . SVD (spontaneous vaginal delivery) 2011, 11/2016   x 2    Past Surgical History:  Procedure Laterality Date  . LAPAROSCOPIC TUBAL LIGATION Bilateral 01/08/2017   Procedure: LAPAROSCOPIC TUBAL LIGATION;  Surgeon: Tereso Newcomer, MD;  Location: [facility];  Service: Gynecology;  Laterality: Bilateral;  . LEEP N/A 01/08/2017   Procedure: LOOP ELECTROSURGICAL EXCISION PROCEDURE (LEEP);  Surgeon: Tereso Newcomer, MD;  Location: [facility];  Service: Gynecology;  Laterality: N/A;  . NO PAST SURGERIES      Family History  Problem Relation Age of Onset  . Hypertension Mother   . Diabetes Mother   . Diabetes Maternal Grandmother   . Hypertension Maternal Grandmother   . Hypertension Maternal Grandfather   . Diabetes Maternal Grandfather     Social History   Socioeconomic History  . Marital status: Single    Spouse name: Not on file  . Number of children: Not on file  . Years of education: Not on file  . Highest education level: Not on file  Occupational History  . Not on file  Tobacco Use  . Smoking status: Never Smoker  . Smokeless tobacco: Never Used  Substance and Sexual Activity  . Alcohol use: Yes    Comment: occasional  . Drug use: No  . Sexual activity: Not Currently    Birth control/protection: Injection    Comment: Depo injection 11/2016  Other Topics Concern  . Not on file  Social History Narrative  . Not on file   Social Determinants of Health    Financial Resource Strain:   . Difficulty of Paying Living Expenses:   Food Insecurity:   . Worried About Programme researcher, broadcasting/film/video in the Last Year:   . Barista in the Last Year:   Transportation Needs:   . Freight forwarder (Medical):   Marland Kitchen Lack of Transportation (Non-Medical):   Physical Activity:   . Days of Exercise per Week:   . Minutes of Exercise per Session:   Stress:   . Feeling of Stress :   Social Connections:   . Frequency of Communication with Friends and Family:   . Frequency of Social Gatherings with Friends and Family:   . Attends Religious Services:   . Active Member of Clubs or Organizations:   . Attends Banker Meetings:   Marland Kitchen Marital Status:   Intimate Partner Violence:   . Fear of Current or Ex-Partner:   . Emotionally Abused:   Marland Kitchen Physically Abused:   . Sexually Abused:     ROS Review of Systems  Constitutional: Negative for fever.  HENT: Negative for congestion and sore throat.   Eyes: Negative for visual disturbance.  Respiratory: Negative for cough and shortness of breath.   Cardiovascular: Negative for chest pain.  Gastrointestinal: Negative for abdominal pain, constipation, diarrhea, nausea and vomiting.  Genitourinary: Negative for dysuria.  Musculoskeletal:  Negative for back pain and neck pain.  Neurological: Negative for dizziness, light-headedness and headaches.  Psychiatric/Behavioral: Negative for sleep disturbance.    Objective:   Today's Vitals: BP 128/74   Pulse (!) 108   Ht 5\' 9"  (1.753 m)   Wt (!) 361 lb 3.2 oz (163.8 kg)   LMP 07/09/2019   SpO2 99%   BMI 53.34 kg/m   Physical Exam Exam conducted with a chaperone present.  Constitutional:      General: She is not in acute distress.    Appearance: Normal appearance. She is obese. She is not ill-appearing.  HENT:     Head: Normocephalic and atraumatic.     Right Ear: Tympanic membrane, ear canal and external ear normal.     Left Ear: Tympanic membrane,  ear canal and external ear normal.     Nose: Nose normal. No congestion or rhinorrhea.     Mouth/Throat:     Mouth: Mucous membranes are moist.     Pharynx: Oropharynx is clear. No oropharyngeal exudate or posterior oropharyngeal erythema.  Eyes:     General: No scleral icterus.       Right eye: No discharge.        Left eye: No discharge.     Extraocular Movements: Extraocular movements intact.     Conjunctiva/sclera: Conjunctivae normal.     Pupils: Pupils are equal, round, and reactive to light.  Cardiovascular:     Rate and Rhythm: Normal rate and regular rhythm.     Pulses: Normal pulses.     Heart sounds: Normal heart sounds. No murmur.  Pulmonary:     Effort: Pulmonary effort is normal. No respiratory distress.     Breath sounds: Normal breath sounds.  Abdominal:     General: Bowel sounds are normal. There is no distension.     Palpations: Abdomen is soft. There is no mass.     Tenderness: There is no abdominal tenderness. There is no right CVA tenderness, left CVA tenderness, guarding or rebound.     Comments: Obese abdomen   Genitourinary:    General: Normal vulva.     Exam position: Lithotomy position.     Pubic Area: No rash.      Tanner stage (genital): 5.     Labia:        Right: No rash or lesion.        Left: No rash or lesion.      Vagina: Normal. No vaginal discharge.     Cervix: Normal.     Uterus: Normal.      Adnexa: Right adnexa normal and left adnexa normal.     Comments: PAP collected  Musculoskeletal:        General: No tenderness. Normal range of motion.     Cervical back: Normal range of motion and neck supple. No tenderness.     Right lower leg: No edema.     Left lower leg: No edema.  Lymphadenopathy:     Cervical: No cervical adenopathy.  Skin:    General: Skin is warm and dry.     Capillary Refill: Capillary refill takes less than 2 seconds.     Findings: No rash.  Neurological:     General: No focal deficit present.     Mental Status:  She is alert and oriented to person, place, and time.     Cranial Nerves: Cranial nerve deficit: 2-12 grossly intact      Sensory: No sensory deficit.  Deep Tendon Reflexes: Reflexes normal.  Psychiatric:        Mood and Affect: Mood normal.        Behavior: Behavior normal.        Thought Content: Thought content normal.        Judgment: Judgment normal.     Assessment & Plan:   Problem List Items Addressed This Visit      Other   Morbid obesity (HCC)    History of hypertension and diabetes.  Pain CMP, lipid panel, and A1c.      Relevant Orders   Lipid panel (Completed)   Comprehensive metabolic panel (Completed)   HgB A1c (Completed)    Other Visit Diagnoses    Screening for cervical cancer    -  Primary   Relevant Orders   Cytology - PAP([facility]) (Completed)      Outpatient Encounter Medications as of 07/22/2019  Medication Sig  . docusate sodium (COLACE) 100 MG capsule Take 1 capsule (100 mg total) 2 (two) times daily as needed by mouth.  Marland Kitchen ibuprofen (ADVIL,MOTRIN) 600 MG tablet Take 1 tablet (600 mg total) every 6 (six) hours as needed by mouth for moderate pain or cramping.  Marland Kitchen oxyCODONE-acetaminophen (PERCOCET/ROXICET) 5-325 MG tablet Take 1-2 tablets every 6 (six) hours as needed by mouth.   No facility-administered encounter medications on file as of 07/22/2019.    Follow-up: No follow-ups on file.   Katha Cabal, DO

## 2019-07-23 LAB — CYTOLOGY - PAP
Chlamydia: NEGATIVE
Comment: NEGATIVE
Comment: NEGATIVE
Comment: NORMAL
Diagnosis: NEGATIVE
Neisseria Gonorrhea: NEGATIVE
Trichomonas: POSITIVE — AB

## 2019-07-23 LAB — POCT GLYCOSYLATED HEMOGLOBIN (HGB A1C): Hemoglobin A1C: 5.4 % (ref 4.0–5.6)

## 2019-07-23 LAB — COMPREHENSIVE METABOLIC PANEL
ALT: 8 IU/L (ref 0–32)
AST: 12 IU/L (ref 0–40)
Albumin/Globulin Ratio: 1.2 (ref 1.2–2.2)
Albumin: 4 g/dL (ref 3.9–5.0)
Alkaline Phosphatase: 83 IU/L (ref 48–121)
BUN/Creatinine Ratio: 15 (ref 9–23)
BUN: 11 mg/dL (ref 6–20)
Bilirubin Total: <0.2 mg/dL (ref 0.0–1.2)
CO2: 24 mmol/L (ref 20–29)
Calcium: 9.9 mg/dL (ref 8.7–10.2)
Chloride: 103 mmol/L (ref 96–106)
Creatinine, Ser: 0.72 mg/dL (ref 0.57–1.00)
GFR calc Af Amer: 132 mL/min/{1.73_m2} (ref >59)
GFR calc non Af Amer: 114 mL/min/{1.73_m2} (ref >59)
Globulin, Total: 3.4 g/dL (ref 1.5–4.5)
Glucose: 88 mg/dL (ref 65–99)
Potassium: 4.7 mmol/L (ref 3.5–5.2)
Sodium: 138 mmol/L (ref 134–144)
Total Protein: 7.4 g/dL (ref 6.0–8.5)

## 2019-07-23 LAB — LIPID PANEL
Chol/HDL Ratio: 3.8 ratio (ref 0.0–4.4)
Cholesterol, Total: 173 mg/dL (ref 100–199)
HDL: 45 mg/dL (ref >39)
LDL Chol Calc (NIH): 112 mg/dL — ABNORMAL HIGH (ref 0–99)
Triglycerides: 84 mg/dL (ref 0–149)
VLDL Cholesterol Cal: 16 mg/dL (ref 5–40)

## 2019-07-24 ENCOUNTER — Encounter: Payer: Self-pay | Admitting: Family Medicine

## 2019-07-24 DIAGNOSIS — A599 Trichomoniasis, unspecified: Secondary | ICD-10-CM

## 2019-07-27 MED ORDER — METRONIDAZOLE 500 MG PO TABS: 500.0000 mg | ORAL_TABLET | Freq: Two times a day (BID) | ORAL | 0 refills | Status: AC

## 2019-07-27 NOTE — Progress Notes (Signed)
Patient positive for trichomoniasis.  Will send 7 day course of Metronidazole to pt's pharmacy. PAP negative for cancerous lesions.

## 2019-07-28 NOTE — Assessment & Plan Note (Signed)
History of hypertension and diabetes.  Pain CMP, lipid panel, and A1c.

## 2019-10-21 ENCOUNTER — Other Ambulatory Visit: Payer: Self-pay

## 2019-10-21 ENCOUNTER — Ambulatory Visit (HOSPITAL_COMMUNITY)
Admission: EM | Admit: 2019-10-21 | Discharge: 2019-10-21 | Disposition: A | Discharge status: Home / Self Care | Payer: Medicaid Other | Attending: Internal Medicine | Admitting: Internal Medicine

## 2019-10-21 DIAGNOSIS — Z20822 Contact with and (suspected) exposure to covid-19: Secondary | ICD-10-CM | POA: Diagnosis not present

## 2019-10-21 LAB — SARS CORONAVIRUS 2 (TAT 6-24 HRS): SARS Coronavirus 2: NEGATIVE

## 2019-10-21 NOTE — ED Triage Notes (Signed)
Pt presents to [facility] for assessment after patient's son's teacher tested positive for COVID.  Encouraged to come for testing.  Denies symptoms at ths time

## 2019-10-21 NOTE — ED Notes (Signed)
Patient able to ambulate independently  

## 2019-11-17 ENCOUNTER — Encounter (HOSPITAL_COMMUNITY): Payer: Self-pay | Admitting: Emergency Medicine

## 2019-11-17 ENCOUNTER — Other Ambulatory Visit: Payer: Self-pay

## 2019-11-17 ENCOUNTER — Ambulatory Visit (HOSPITAL_COMMUNITY)
Admission: EM | Admit: 2019-11-17 | Discharge: 2019-11-17 | Disposition: A | Discharge status: Home / Self Care | Payer: Medicaid Other | Attending: Internal Medicine | Admitting: Internal Medicine

## 2019-11-17 DIAGNOSIS — Z79899 Other long term (current) drug therapy: Secondary | ICD-10-CM | POA: Insufficient documentation

## 2019-11-17 DIAGNOSIS — Z20822 Contact with and (suspected) exposure to covid-19: Secondary | ICD-10-CM | POA: Diagnosis not present

## 2019-11-17 DIAGNOSIS — J069 Acute upper respiratory infection, unspecified: Secondary | ICD-10-CM | POA: Diagnosis not present

## 2019-11-17 DIAGNOSIS — J029 Acute pharyngitis, unspecified: Secondary | ICD-10-CM | POA: Diagnosis not present

## 2019-11-17 LAB — SARS CORONAVIRUS 2 (TAT 6-24 HRS): SARS Coronavirus 2: NEGATIVE

## 2019-11-17 MED ORDER — FLUTICASONE PROPIONATE 50 MCG/ACT NA SUSP: 1.0000 | Freq: Every day | NASAL | 2 refills | Status: DC

## 2019-11-17 MED ORDER — CEPACOL SORE THROAT 5.4 MG MT LOZG: 1.0000 | LOZENGE | OROMUCOSAL | 0 refills | Status: DC | PRN

## 2019-11-17 NOTE — ED Triage Notes (Signed)
Patient presents to [facility] for assessment of stuffy nose, and sore throat in the mornings x 1 week

## 2019-11-17 NOTE — ED Provider Notes (Signed)
[facility]    CSN: [id number] Arrival date & time: 11/17/19  1041      History   Chief Complaint Chief Complaint  Patient presents with  . Nasal Congestion    HPI Carly Hopkins is a 29 y.o. female.   Patient reports for evaluation of runny nose and slight scratchy throat.  She reports she has had congestion, runny nose and slight scratchy throat primarily in the morning since late last week, patient believes Wednesday or Thursday.  She reports sore throat is only present in the morning and clears up throughout the day.  Not very bothersome.  She denies cough, fever abdominal pain, nausea or vomiting.  No change in taste or smell.  She reports her daughter and son started with similar symptoms prior to her symptoms.  She reports she feels overall well.  Did not get Covid vaccinations and would like Covid testing today.     Past Medical History:  Diagnosis Date  . Anemia   . 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 Hypertension    Resolved - only with pregnancy  . Morbid obesity (HCC) 01/08/2017  . Severe dysplasia of cervix (CIN III)    After HGSIL pap  . SVD (spontaneous vaginal delivery) 2011, 11/2016   x 2    Patient Active Problem List   Diagnosis Date Noted  . Morbid obesity (HCC) 01/08/2017  . Severe dysplasia of cervix (CIN III) 06/07/2016    Past Surgical History:  Procedure Laterality Date  . LAPAROSCOPIC TUBAL LIGATION Bilateral 01/08/2017   Procedure: LAPAROSCOPIC TUBAL LIGATION;  Surgeon: Tereso Newcomer, MD;  Location: [facility];  Service: Gynecology;  Laterality: Bilateral;  . LEEP N/A 01/08/2017   Procedure: LOOP ELECTROSURGICAL EXCISION PROCEDURE (LEEP);  Surgeon: Tereso Newcomer, MD;  Location: [facility];  Service: Gynecology;  Laterality: N/A;  . NO PAST SURGERIES      OB History    Gravida  2   Para  1   Term  1   Preterm      AB      Living  1     SAB      TAB      Ectopic      Multiple      Live  Births  1            Home Medications    Prior to Admission medications   Medication Sig Start Date End Date Taking? Authorizing Provider  docusate sodium (COLACE) 100 MG capsule Take 1 capsule (100 mg total) 2 (two) times daily as needed by mouth. 01/08/17   Anyanwu, Jethro Bastos, MD  fluticasone (FLONASE) 50 MCG/ACT nasal spray Place 1 spray into both nostrils daily. 11/17/19   Gumaro Brightbill, Veryl Speak, PA-C  ibuprofen (ADVIL,MOTRIN) 600 MG tablet Take 1 tablet (600 mg total) every 6 (six) hours as needed by mouth for moderate pain or cramping. 01/08/17   Anyanwu, Jethro Bastos, MD  Menthol (CEPACOL SORE THROAT) 5.4 MG LOZG Use as directed 1 lozenge (5.4 mg total) in the mouth or throat every 2 (two) hours as needed. 11/17/19   Haydan Wedig, Veryl Speak, PA-C  oxyCODONE-acetaminophen (PERCOCET/ROXICET) 5-325 MG tablet Take 1-2 tablets every 6 (six) hours as needed by mouth. 01/08/17   Anyanwu, Jethro Bastos, MD    Family History Family History  Problem Relation Age of Onset  . Hypertension Mother   . Diabetes Mother   . Diabetes Maternal Grandmother   . Hypertension Maternal Grandmother   .  Hypertension Maternal Grandfather   . Diabetes Maternal Grandfather     Social History Social History   Tobacco Use  . Smoking status: Never Smoker  . Smokeless tobacco: Never Used  Vaping Use  . Vaping Use: Never used  Substance Use Topics  . Alcohol use: Yes    Comment: occasional  . Drug use: No     Allergies   Patient has no known allergies.   Review of Systems Review of Systems   Physical Exam Triage Vital Signs ED Triage Vitals  Enc Vitals Group     BP 11/17/19 1201 (!) 137/94     Pulse Rate 11/17/19 1201 97     Resp 11/17/19 1201 18     Temp 11/17/19 1201 98.3 F (36.8 C)     Temp Source 11/17/19 1201 Oral     SpO2 11/17/19 1201 100 %     Weight --      Height --      Head Circumference --      Peak Flow --      Pain Score 11/17/19 1155 0     Pain Loc --      Pain Edu? --      Excl. in GC?  --    No data found.  Updated Vital Signs BP (!) 137/94 (BP Location: Left Arm)   Pulse 97   Temp 98.3 F (36.8 C) (Oral)   Resp 18   SpO2 100%   Visual Acuity Right Eye Distance:   Left Eye Distance:   Bilateral Distance:    Right Eye Near:   Left Eye Near:    Bilateral Near:     Physical Exam Vitals and nursing note reviewed.  Constitutional:      General: She is not in acute distress.    Appearance: She is well-developed. She is not ill-appearing.  HENT:     Head: Normocephalic and atraumatic.     Nose: Congestion present. No rhinorrhea.     Mouth/Throat:     Mouth: Mucous membranes are moist.     Pharynx: Oropharynx is clear.  Eyes:     Conjunctiva/sclera: Conjunctivae normal.  Cardiovascular:     Rate and Rhythm: Normal rate and regular rhythm.     Heart sounds: No murmur heard.   Pulmonary:     Effort: Pulmonary effort is normal. No respiratory distress.     Breath sounds: Normal breath sounds.  Abdominal:     Palpations: Abdomen is soft.     Tenderness: There is no abdominal tenderness.  Musculoskeletal:     Cervical back: Neck supple.  Skin:    General: Skin is warm and dry.  Neurological:     Mental Status: She is alert.      UC Treatments / Results  Labs (all labs ordered are listed, but only abnormal results are displayed) Labs Reviewed  SARS CORONAVIRUS 2 (TAT 6-24 HRS)    EKG   Radiology No results found.  Procedures Procedures (including critical care time)  Medications Ordered in UC Medications - No data to display  Initial Impression / Assessment and Plan / UC Course  I have reviewed the triage vital signs and the nursing notes.  Pertinent labs & imaging results that were available during my care of the patient were reviewed by me and considered in my medical decision making (see chart for details).     #Viral URI Patient is 29 year old presenting with viral upper respiratory symptoms.  Afebrile, normal vital signs and  reassuring exam.  Very mild symptoms.  Recommend Flonase and Cepacol.  Covid test sent.  Discussed return, follow-up and emergency room precautions.  Patient verbalized understand plan of care Final Clinical Impressions(s) / UC Diagnoses   Final diagnoses:  Viral upper respiratory tract infection     Discharge Instructions     Use of Flonase and Cepacol lozenges as directed  Monitor your symptoms for severe symptoms such as shortness of breath, high fevers or other concerning symptoms, return or go to the emergency department  Follow-up with your primary care as needed  If your Covid-19 test is positive, you will receive a phone call from [facility] regarding your results. Negative test results are not called. Both positive and negative results area always visible on MyChart. If you do not have a MyChart account, sign up instructions are in your discharge papers.   Persons who are directed to care for themselves at home may discontinue isolation under the following conditions:  . At least 10 days have passed since symptom onset and . At least 24 hours have passed without running a fever (this means without the use of fever-reducing medications) and . Other symptoms have improved.  Persons infected with COVID-19 who never develop symptoms may discontinue isolation and other precautions 10 days after the date of their first positive COVID-19 test.       ED Prescriptions    Medication Sig Dispense Auth. Provider   fluticasone (FLONASE) 50 MCG/ACT nasal spray Place 1 spray into both nostrils daily. 15.8 mL Wrenly Lauritsen, Veryl Speak, PA-C   Menthol (CEPACOL SORE THROAT) 5.4 MG LOZG Use as directed 1 lozenge (5.4 mg total) in the mouth or throat every 2 (two) hours as needed. 30 lozenge Rudolfo Brandow, Veryl Speak, PA-C     PDMP not reviewed this encounter.   Hermelinda Medicus, PA-C 11/17/19 1235

## 2019-11-17 NOTE — Discharge Instructions (Signed)
Use of Flonase and Cepacol lozenges as directed  Monitor your symptoms for severe symptoms such as shortness of breath, high fevers or other concerning symptoms, return or go to the emergency department  Follow-up with your primary care as needed  If your Covid-19 test is positive, you will receive a phone call from [facility] regarding your results. Negative test results are not called. Both positive and negative results area always visible on MyChart. If you do not have a MyChart account, sign up instructions are in your discharge papers.   Persons who are directed to care for themselves at home may discontinue isolation under the following conditions:   At least 10 days have passed since symptom onset and  At least 24 hours have passed without running a fever (this means without the use of fever-reducing medications) and  Other symptoms have improved.  Persons infected with COVID-19 who never develop symptoms may discontinue isolation and other precautions 10 days after the date of their first positive COVID-19 test.

## 2020-12-14 ENCOUNTER — Encounter (HOSPITAL_COMMUNITY): Payer: Self-pay | Admitting: Emergency Medicine

## 2020-12-14 ENCOUNTER — Other Ambulatory Visit: Payer: Self-pay

## 2020-12-14 ENCOUNTER — Ambulatory Visit (HOSPITAL_COMMUNITY)
Admission: EM | Admit: 2020-12-14 | Discharge: 2020-12-14 | Disposition: A | Discharge status: Home / Self Care | Payer: Medicaid Other | Attending: Emergency Medicine | Admitting: Emergency Medicine

## 2020-12-14 DIAGNOSIS — R350 Frequency of micturition: Secondary | ICD-10-CM | POA: Diagnosis not present

## 2020-12-14 DIAGNOSIS — Z113 Encounter for screening for infections with a predominantly sexual mode of transmission: Secondary | ICD-10-CM | POA: Insufficient documentation

## 2020-12-14 LAB — POCT URINALYSIS DIPSTICK, ED / UC
Bilirubin Urine: NEGATIVE
Glucose, UA: NEGATIVE mg/dL
Leukocytes,Ua: NEGATIVE
Nitrite: POSITIVE — AB
Protein, ur: NEGATIVE mg/dL
Specific Gravity, Urine: >=1.03 (ref 1.005–1.030)
Urobilinogen, UA: 0.2 mg/dL (ref 0.0–1.0)
pH: 5 (ref 5.0–8.0)

## 2020-12-14 LAB — RPR: RPR Ser Ql: NONREACTIVE

## 2020-12-14 LAB — POC URINE PREG, ED: Preg Test, Ur: NEGATIVE

## 2020-12-14 LAB — HIV ANTIBODY (ROUTINE TESTING W REFLEX): HIV Screen 4th Generation wRfx: NONREACTIVE

## 2020-12-14 NOTE — ED Provider Notes (Signed)
[facility]   [id number] 12/14/20 Arrival Time: 0840   CC: CONCERN FOR STD  SUBJECTIVE:  Carly Hopkins is a 30 y.o. female who presents requesting STI screening.  Currently asymptomatic.  Denies any known exposure to STI positive partners.   Denies similar symptoms in the past.    Denies fever, chills, nausea, vomiting, abdominal or pelvic pain, urinary symptoms, vaginal itching, vaginal odor, vaginal bleeding, dyspareunia, vaginal rashes or lesions.   No LMP recorded.  ROS: As per HPI.  All other pertinent ROS negative.     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   Hypertension    Resolved - only with pregnancy   Morbid obesity (HCC) 01/08/2017   Severe dysplasia of cervix (CIN III)    After HGSIL pap   SVD (spontaneous vaginal delivery) 2011, 11/2016   x 2   Past Surgical History:  Procedure Laterality Date   LAPAROSCOPIC TUBAL LIGATION Bilateral 01/08/2017   Procedure: LAPAROSCOPIC TUBAL LIGATION;  Surgeon: Tereso Newcomer, MD;  Location: [facility];  Service: Gynecology;  Laterality: Bilateral;   LEEP N/A 01/08/2017   Procedure: LOOP ELECTROSURGICAL EXCISION PROCEDURE (LEEP);  Surgeon: Tereso Newcomer, MD;  Location: [facility];  Service: Gynecology;  Laterality: N/A;   NO PAST SURGERIES     No Known Allergies No current facility-administered medications on file prior to encounter.   Current Outpatient Medications on File Prior to Encounter  Medication Sig Dispense Refill   docusate sodium (COLACE) 100 MG capsule Take 1 capsule (100 mg total) 2 (two) times daily as needed by mouth. (Patient not taking: Reported on 12/14/2020) 30 capsule 2   fluticasone (FLONASE) 50 MCG/ACT nasal spray Place 1 spray into both nostrils daily. (Patient not taking: Reported on 12/14/2020) 15.8 mL 2   ibuprofen (ADVIL,MOTRIN) 600 MG tablet Take 1 tablet (600 mg total) every 6 (six) hours as needed by mouth for moderate  pain or cramping. 30 tablet 2   Menthol (CEPACOL SORE THROAT) 5.4 MG LOZG Use as directed 1 lozenge (5.4 mg total) in the mouth or throat every 2 (two) hours as needed. (Patient not taking: Reported on 12/14/2020) 30 lozenge 0   oxyCODONE-acetaminophen (PERCOCET/ROXICET) 5-325 MG tablet Take 1-2 tablets every 6 (six) hours as needed by mouth. (Patient not taking: Reported on 12/14/2020) 20 tablet 0   Social History   Socioeconomic History   Marital status: Single    Spouse name: Not on file   Number of children: Not on file   Years of education: Not on file   Highest education level: Not on file  Occupational History   Not on file  Tobacco Use   Smoking status: Never   Smokeless tobacco: Never  Vaping Use   Vaping Use: Never used  Substance and Sexual Activity   Alcohol use: Not Currently    Comment: occasional   Drug use: No   Sexual activity: Not Currently    Birth control/protection: Surgical  Other Topics Concern   Not on file  Social History Narrative   Not on file   Social Determinants of Health   Financial Resource Strain: Not on file  Food Insecurity: Not on file  Transportation Needs: Not on file  Physical Activity: Not on file  Stress: Not on file  Social Connections: Not on file  Intimate Partner Violence: Not on file   Family History  Problem Relation Age of Onset   Hypertension Mother  Diabetes Mother    Diabetes Maternal Grandmother    Hypertension Maternal Grandmother    Hypertension Maternal Grandfather    Diabetes Maternal Grandfather     OBJECTIVE:  Vitals:   12/14/20 0911  BP: (!) 141/86  Pulse: 88  Resp: (!) 22  Temp: 98.3 F (36.8 C)  TempSrc: Oral  SpO2: 98%     General appearance: alert, NAD, appears stated age Head: NCAT Throat: lips, mucosa, and tongue normal; teeth and gums normal Lungs: CTA bilaterally without adventitious breath sounds Heart: regular rate and rhythm.  Radial pulses 2+ symmetrical bilaterally Back: no  CVA tenderness Abdomen: soft, non-tender; bowel sounds normal; no masses or organomegaly; no guarding or rebound tenderness GU: deferred Skin: warm and dry Psychological:  Alert and cooperative. Normal mood and affect.  LABS:  Results for orders placed or performed during the hospital encounter of 12/14/20  POC Urinalysis dipstick  Result Value Ref Range   Glucose, UA NEGATIVE NEGATIVE mg/dL   Bilirubin Urine NEGATIVE NEGATIVE   Ketones, ur TRACE (A) NEGATIVE mg/dL   Specific Gravity, Urine >=1.030 1.005 - 1.030   Hgb urine dipstick SMALL (A) NEGATIVE   pH 5.0 5.0 - 8.0   Protein, ur NEGATIVE NEGATIVE mg/dL   Urobilinogen, UA 0.2 0.0 - 1.0 mg/dL   Nitrite POSITIVE (A) NEGATIVE   Leukocytes,Ua NEGATIVE NEGATIVE  POC urine pregnancy  Result Value Ref Range   Preg Test, Ur NEGATIVE NEGATIVE    Labs Reviewed  POCT URINALYSIS DIPSTICK, ED / UC - Abnormal; Notable for the following components:      Result Value   Ketones, ur TRACE (*)    Hgb urine dipstick SMALL (*)    Nitrite POSITIVE (*)    All other components within normal limits  URINE CULTURE  RPR  HIV ANTIBODY (ROUTINE TESTING W REFLEX)  POC URINE PREG, ED  CERVICOVAGINAL ANCILLARY ONLY    ASSESSMENT & PLAN:  1. Screen for STD (sexually transmitted disease)   2. Urinary frequency     No orders of the defined types were placed in this encounter.   Pending: Labs Reviewed  POCT URINALYSIS DIPSTICK, ED / UC - Abnormal; Notable for the following components:      Result Value   Ketones, ur TRACE (*)    Hgb urine dipstick SMALL (*)    Nitrite POSITIVE (*)    All other components within normal limits  URINE CULTURE  RPR  HIV ANTIBODY (ROUTINE TESTING W REFLEX)  POC URINE PREG, ED  CERVICOVAGINAL ANCILLARY ONLY    Self swab obtained and will treat based on results RPR and HIV pending Urinalysis with nitrites, ketones, and hgb.  Patient reports some possible urinary frequency but denies any dysuria so we will  obtain a urine culture prior to antibiotics. Pregnancy test negative We will follow up with you regarding the results of your test If tests are positive, please abstain from sexual activity until you and your partner(s) are treated Follow up with PCP or [facility] if symptoms persists Return here or go to ER if you have any new or worsening symptoms    Reviewed expectations re: course of current medical issues. Questions answered. Outlined signs and symptoms indicating need for more acute intervention. Patient verbalized understanding. After Visit Summary given.        Ivette Loyal, NP 12/14/20 [phone]

## 2020-12-14 NOTE — ED Triage Notes (Signed)
patient denies any std symptoms.  Just wanting to be tested.  Denies pain

## 2020-12-14 NOTE — Discharge Instructions (Signed)
We will contact you if the results from your lab work are positive and require additional treatment.    Do not have sex while taking undergoing treatment for STI.  Make sure that all of your partners get tested and treated.   Use a condom or other barrier method for all sexual encounters.    Return or go to the Emergency Department if symptoms worsen or do not improve in the next few days.  

## 2020-12-15 LAB — CERVICOVAGINAL ANCILLARY ONLY
Bacterial Vaginitis (gardnerella): POSITIVE — AB
Candida Glabrata: NEGATIVE
Candida Vaginitis: NEGATIVE
Chlamydia: NEGATIVE
Comment: NEGATIVE
Comment: NEGATIVE
Comment: NEGATIVE
Comment: NEGATIVE
Comment: NEGATIVE
Comment: NORMAL
Neisseria Gonorrhea: NEGATIVE
Trichomonas: NEGATIVE

## 2020-12-16 LAB — URINE CULTURE: Culture: 80000 — AB

## 2020-12-21 ENCOUNTER — Telehealth: Payer: Self-pay

## 2020-12-21 MED ORDER — METRONIDAZOLE 500 MG PO TABS
500.0000 mg | ORAL_TABLET | Freq: Two times a day (BID) | ORAL | 0 refills | Status: DC
Start: 2020-12-21 — End: 2022-04-04

## 2020-12-21 MED ORDER — NITROFURANTOIN MONOHYD MACRO 100 MG PO CAPS: 100.0000 mg | ORAL_CAPSULE | Freq: Two times a day (BID) | ORAL | 0 refills | Status: DC

## 2020-12-29 ENCOUNTER — Encounter: Payer: Self-pay | Admitting: Nurse Practitioner

## 2020-12-29 ENCOUNTER — Telehealth: Payer: Medicaid Other | Admitting: Nurse Practitioner

## 2020-12-29 DIAGNOSIS — B309 Viral conjunctivitis, unspecified: Secondary | ICD-10-CM | POA: Diagnosis not present

## 2020-12-29 NOTE — Patient Instructions (Addendum)
  [name], thank you for joining Claiborne Rigg, NP for today's virtual visit.  While this provider is not your primary care provider (PCP), if your PCP is located in our provider database this encounter information will be shared with them immediately following your visit.  Consent: (Patient) [name] provided verbal consent for this virtual visit at the beginning of the encounter.  Current Medications:  Current Outpatient Medications:    docusate sodium (COLACE) 100 MG capsule, Take 1 capsule (100 mg total) 2 (two) times daily as needed by mouth. (Patient not taking: Reported on 12/14/2020), Disp: 30 capsule, Rfl: 2   fluticasone (FLONASE) 50 MCG/ACT nasal spray, Place 1 spray into both nostrils daily. (Patient not taking: Reported on 12/14/2020), Disp: 15.8 mL, Rfl: 2   ibuprofen (ADVIL,MOTRIN) 600 MG tablet, Take 1 tablet (600 mg total) every 6 (six) hours as needed by mouth for moderate pain or cramping., Disp: 30 tablet, Rfl: 2   Menthol (CEPACOL SORE THROAT) 5.4 MG LOZG, Use as directed 1 lozenge (5.4 mg total) in the mouth or throat every 2 (two) hours as needed. (Patient not taking: Reported on 12/14/2020), Disp: 30 lozenge, Rfl: 0   metroNIDAZOLE (FLAGYL) 500 MG tablet, Take 1 tablet (500 mg total) by mouth 2 (two) times daily., Disp: 14 tablet, Rfl: 0   nitrofurantoin, macrocrystal-monohydrate, (MACROBID) 100 MG capsule, Take 1 capsule (100 mg total) by mouth 2 (two) times daily., Disp: 10 capsule, Rfl: 0   oxyCODONE-acetaminophen (PERCOCET/ROXICET) 5-325 MG tablet, Take 1-2 tablets every 6 (six) hours as needed by mouth. (Patient not taking: Reported on 12/14/2020), Disp: 20 tablet, Rfl: 0   Medications ordered in this encounter:  No orders of the defined types were placed in this encounter.    *If you need refills on other medications prior to your next appointment, please contact your pharmacy*  Follow-Up: Call back or seek an in-person evaluation if the  symptoms worsen or if the condition fails to improve as anticipated.  Other Instructions   If you have been instructed to have an in-person evaluation today at a local Urgent Care facility, please use the link below. It will take you to a list of all of our available [facility], including address, phone number and hours of operation. Please do not delay care.  [facility]  If you or a family member do not have a primary care provider, use the link below to schedule a visit and establish care. When you choose a [facility] primary care physician or advanced practice provider, you gain a long-term partner in health. Find a Primary Care Provider  Learn more about [facility]'s in-office and virtual care options: [facility] - Get Care Now

## 2020-12-29 NOTE — Addendum Note (Signed)
Addended by: Bertram Denver on: 12/29/2020 07:39 PM   Modules accepted: Level of Service

## 2020-12-29 NOTE — Progress Notes (Signed)
I, Claiborne Rigg, NP, attempted to connect with ADMIRE BUNNELL; MRN [id number] on 12/29/20 via [employer] to complete a video urgent care visit. The patient was unable to successfully connect to the video platform with audio however her face was able to be visualized. As such, the patient was contacted by this provider via phone to complete the encounter.    Virtual Visit Consent   TERINA MCELHINNY, you are scheduled for a virtual visit with a [facility] provider today.     Just as with appointments in the office, your consent must be obtained to participate.  Your consent will be active for this visit and any virtual visit you may have with one of our providers in the next 365 days.     If you have a MyChart account, a copy of this consent can be sent to you electronically.  All virtual visits are billed to your insurance company just like a traditional visit in the office.    As this is a virtual visit, video technology does not allow for your provider to perform a traditional examination.  This may limit your provider's ability to fully assess your condition.  If your provider identifies any concerns that need to be evaluated in person or the need to arrange testing (such as labs, EKG, etc.), we will make arrangements to do so.     Although advances in technology are sophisticated, we cannot ensure that it will always work on either your end or our end.  If the connection with a video visit is poor, the visit may have to be switched to a telephone visit.  With either a video or telephone visit, we are not always able to ensure that we have a secure connection.     I need to obtain your verbal consent now.   Are you willing to proceed with your visit today?    AMARRI MICHAELSON has provided verbal consent on 12/29/2020 for a virtual visit (video or telephone).   Claiborne Rigg, NP   Date: 12/29/2020 4:25 PM   Virtual Visit via Video Note   I, Claiborne Rigg, connected with  ZARETH RIPPETOE  ([id number], 11-24-1990) on 12/29/20 at  4:45 PM EDT by a video-enabled telemedicine application and verified that I am speaking with the correct person using two identifiers.  Location: Patient: Virtual Visit Location Patient: Home Provider: Virtual Visit Location Provider: Home Office   I discussed the limitations of evaluation and management by telemedicine and the availability of in person appointments. The patient expressed understanding and agreed to proceed.    History of Present Illness: Carly Hopkins is a 30 y.o. who identifies as a female who was assigned female at birth, and is being seen today for Pink Eye.  HPI: Notes left eye with itching, discomfort, matting and clear drainage since this morning.  Associated symptoms: Sore throat. States she went to a halloween party 5 days ago and not sure if someone there had pink eye. Denies fever, cough or visual disturbance.    Problems:  Patient Active Problem List   Diagnosis Date Noted   Morbid obesity (HCC) 01/08/2017   Severe dysplasia of cervix (CIN III) 06/07/2016    Allergies: No Known Allergies Medications:  Current Outpatient Medications:    docusate sodium (COLACE) 100 MG capsule, Take 1 capsule (100 mg total) 2 (two) times daily as needed by mouth. (Patient not taking: Reported on 12/14/2020), Disp: 30 capsule, Rfl: 2  fluticasone (FLONASE) 50 MCG/ACT nasal spray, Place 1 spray into both nostrils daily. (Patient not taking: Reported on 12/14/2020), Disp: 15.8 mL, Rfl: 2   ibuprofen (ADVIL,MOTRIN) 600 MG tablet, Take 1 tablet (600 mg total) every 6 (six) hours as needed by mouth for moderate pain or cramping., Disp: 30 tablet, Rfl: 2   Menthol (CEPACOL SORE THROAT) 5.4 MG LOZG, Use as directed 1 lozenge (5.4 mg total) in the mouth or throat every 2 (two) hours as needed. (Patient not taking: Reported on 12/14/2020), Disp: 30 lozenge, Rfl: 0   metroNIDAZOLE (FLAGYL) 500 MG tablet, Take 1 tablet (500 mg total) by  mouth 2 (two) times daily., Disp: 14 tablet, Rfl: 0   nitrofurantoin, macrocrystal-monohydrate, (MACROBID) 100 MG capsule, Take 1 capsule (100 mg total) by mouth 2 (two) times daily., Disp: 10 capsule, Rfl: 0   oxyCODONE-acetaminophen (PERCOCET/ROXICET) 5-325 MG tablet, Take 1-2 tablets every 6 (six) hours as needed by mouth. (Patient not taking: Reported on 12/14/2020), Disp: 20 tablet, Rfl: 0  Observations/Objective: Patient is well-developed, well-nourished in no acute distress.  Resting comfortably  at home.  Head is normocephalic, atraumatic.  No labored breathing.  Speech is clear and coherent with logical content.  Patient is alert and oriented at baseline.  Left eye with no visible redness or matting. There does appear to be minimal periorbital swelling but no obvious signs of infection.   Assessment and Plan: 1. Viral conjunctivitis Apply cold compresses to eye affected eye as needed May take tylenol or ibuprofen for pain.  Wash hands after applying compresses to affected eye  Follow Up Instructions: I discussed the assessment and treatment plan with the patient. The patient was provided an opportunity to ask questions and all were answered. The patient agreed with the plan and demonstrated an understanding of the instructions.  A copy of instructions were sent to the patient via MyChart unless otherwise noted below.     The patient was advised to call back or seek an in-person evaluation if the symptoms worsen or if the condition fails to improve as anticipated.  Time:  I spent 5 minutes with the patient via telehealth technology discussing the above problems/concerns.    Gildardo Pounds, NP

## 2021-08-01 ENCOUNTER — Encounter: Payer: Self-pay | Admitting: *Deleted

## 2021-08-07 ENCOUNTER — Telehealth: Payer: Self-pay | Admitting: Pulmonary Disease

## 2021-08-09 NOTE — Telephone Encounter (Signed)
Patient is not a patient of our practice. Attempted to call but her VM was full.

## 2022-01-27 ENCOUNTER — Emergency Department (HOSPITAL_COMMUNITY): Payer: Medicaid Other

## 2022-01-27 ENCOUNTER — Emergency Department (HOSPITAL_COMMUNITY)
Admission: EM | Admit: 2022-01-27 | Discharge: 2022-01-28 | Disposition: A | Discharge status: Home / Self Care | Payer: Medicaid Other | Attending: Emergency Medicine | Admitting: Emergency Medicine

## 2022-01-27 ENCOUNTER — Encounter (HOSPITAL_COMMUNITY): Payer: Self-pay | Admitting: *Deleted

## 2022-01-27 ENCOUNTER — Other Ambulatory Visit: Payer: Self-pay

## 2022-01-27 DIAGNOSIS — R209 Unspecified disturbances of skin sensation: Secondary | ICD-10-CM | POA: Diagnosis not present

## 2022-01-27 DIAGNOSIS — R200 Anesthesia of skin: Secondary | ICD-10-CM | POA: Diagnosis not present

## 2022-01-27 DIAGNOSIS — R202 Paresthesia of skin: Secondary | ICD-10-CM | POA: Diagnosis not present

## 2022-01-27 DIAGNOSIS — T68XXXA Hypothermia, initial encounter: Secondary | ICD-10-CM | POA: Diagnosis not present

## 2022-01-27 DIAGNOSIS — R000 Tachycardia, unspecified: Secondary | ICD-10-CM | POA: Insufficient documentation

## 2022-01-27 DIAGNOSIS — Z743 Need for continuous supervision: Secondary | ICD-10-CM | POA: Diagnosis not present

## 2022-01-27 DIAGNOSIS — I959 Hypotension, unspecified: Secondary | ICD-10-CM | POA: Diagnosis not present

## 2022-01-27 LAB — CBC
HCT: 37.1 % (ref 36.0–46.0)
Hemoglobin: 12 g/dL (ref 12.0–15.0)
MCH: 27.6 pg (ref 26.0–34.0)
MCHC: 32.3 g/dL (ref 30.0–36.0)
MCV: 85.3 fL (ref 80.0–100.0)
Platelets: 374 10*3/uL (ref 150–400)
RBC: 4.35 MIL/uL (ref 3.87–5.11)
RDW: 13.8 % (ref 11.5–15.5)
WBC: 10.4 10*3/uL (ref 4.0–10.5)
nRBC: 0 % (ref 0.0–0.2)

## 2022-01-27 LAB — BASIC METABOLIC PANEL
Anion gap: 6 (ref 5–15)
BUN: 8 mg/dL (ref 6–20)
CO2: 23 mmol/L (ref 22–32)
Calcium: 8.8 mg/dL — ABNORMAL LOW (ref 8.9–10.3)
Chloride: 109 mmol/L (ref 98–111)
Creatinine, Ser: 0.85 mg/dL (ref 0.44–1.00)
GFR, Estimated: >60 mL/min (ref >60)
Glucose, Bld: 124 mg/dL — ABNORMAL HIGH (ref 70–99)
Potassium: 3.5 mmol/L (ref 3.5–5.1)
Sodium: 138 mmol/L (ref 135–145)

## 2022-01-27 LAB — I-STAT BETA HCG BLOOD, ED (MC, WL, AP ONLY): I-stat hCG, quantitative: <5 m[IU]/mL (ref <5)

## 2022-01-27 LAB — TROPONIN I (HIGH SENSITIVITY)
Troponin I (High Sensitivity): 2 ng/L (ref <18)
Troponin I (High Sensitivity): 2 ng/L (ref <18)

## 2022-01-27 MED ORDER — GADOBUTROL 1 MMOL/ML IV SOLN
10.0000 mL | Freq: Once | INTRAVENOUS | Status: AC | PRN
  Administered 2022-01-27: 10 mL via INTRAVENOUS

## 2022-01-27 MED ORDER — LACTATED RINGERS IV BOLUS
1000.0000 mL | Freq: Once | INTRAVENOUS | Status: AC
  Administered 2022-01-27: 1000 mL via INTRAVENOUS

## 2022-01-27 NOTE — ED Triage Notes (Signed)
Pt here via [facility] from home.  Pt was laying in bed and experienced numbness and tingling to L arm and L leg.  HR was 156 when ems arrived and decreased to 130.    150/100 98% RA Cbg 147  20 L AC  Stroke scale neg per [facility]

## 2022-01-27 NOTE — Discharge Instructions (Addendum)
While in the emergency department he received labs which did not show any significant abnormality and imaging was did not show any signs of abnormality.  Please follow-up with your primary care doctor in 48 hours for reevaluation and return to the emergency department if symptoms return.

## 2022-01-27 NOTE — ED Provider Notes (Signed)
[facility] EMERGENCY DEPARTMENT Provider Note   CSN: [id number] Arrival date & time: 01/27/22  1824     History {Add pertinent medical, surgical, social history, OB history to HPI:1} Chief Complaint  Patient presents with   Tachycardia    Carly Hopkins is a 31 y.o. female.  HPI  The patient is a 31 year old female with no reported past medical history presenting by EMS for evaluation of left hemibody numbness.  The patient states that she acutely developed numbness in her left upper and left lower extremity shortly before presentation while laying on the couch.  On EMSs arrival, the patient was found to be tachycardic with a heart rate of 156 which improved to 130.  The patient currently denies chest pain, shortness of breath, nausea, vomiting, weakness, headache, gait instability, medication changes, changes to p.o. intake.  She is endorsing continued numbness in the left upper and lower extremities.  The patient is a 31 year old female with no reported past medical history presenting by EMS for evaluation of left hemibody numbness.  The differential diagnosis considered includes: CVA, TIA, electrolyte disturbance, hypoglycemia, trauma.  On arrival, the patient was hypertensive with a blood pressure of 128/102 and tachycardic with a heart rate of 120 but afebrile.  The patient's physical exam revealed intact cranial nerves with 5/5 strength in the bilateral upper and lower extremities without dysmetria.  The patient was endorsing mildly diminished sensation on the left upper and lower extremities when compared to the right.  The patient's diagnostic work-up included an EKG which showed sinus tachycardia with appropriate intervals and no ischemic changes.  She received a chest x-ray which showed no signs of focal consolidation suggest pneumonia, or other cardiopulmonary abnormality.  She received a CBC with white blood cell count of 10.4 and hemoglobin of 12.0; hCG  which was negative; troponin***; and BMP***.  A CT head was also ordered which showed***.         Home Medications Prior to Admission medications   Medication Sig Start Date End Date Taking? Authorizing Provider  docusate sodium (COLACE) 100 MG capsule Take 1 capsule (100 mg total) 2 (two) times daily as needed by mouth. Patient not taking: Reported on 12/14/2020 01/08/17   Anyanwu, Jethro Bastos, MD  fluticasone (FLONASE) 50 MCG/ACT nasal spray Place 1 spray into both nostrils daily. Patient not taking: Reported on 12/14/2020 11/17/19   Darr, Gerilyn Pilgrim, PA-C  ibuprofen (ADVIL,MOTRIN) 600 MG tablet Take 1 tablet (600 mg total) every 6 (six) hours as needed by mouth for moderate pain or cramping. 01/08/17   Anyanwu, Jethro Bastos, MD  Menthol (CEPACOL SORE THROAT) 5.4 MG LOZG Use as directed 1 lozenge (5.4 mg total) in the mouth or throat every 2 (two) hours as needed. Patient not taking: Reported on 12/14/2020 11/17/19   Darr, Gerilyn Pilgrim, PA-C  metroNIDAZOLE (FLAGYL) 500 MG tablet Take 1 tablet (500 mg total) by mouth 2 (two) times daily. 12/21/20   Lamptey, Britta Mccreedy, MD  nitrofurantoin, macrocrystal-monohydrate, (MACROBID) 100 MG capsule Take 1 capsule (100 mg total) by mouth 2 (two) times daily. 12/21/20   LampteyBritta Mccreedy, MD  oxyCODONE-acetaminophen (PERCOCET/ROXICET) 5-325 MG tablet Take 1-2 tablets every 6 (six) hours as needed by mouth. Patient not taking: Reported on 12/14/2020 01/08/17   Tereso Newcomer, MD      Allergies    Patient has no known allergies.    Review of Systems   Review of Systems  See HPI  Physical Exam Updated Vital Signs BP Marland Kitchen)  128/102   Pulse (!) 120   Temp 98.6 F (37 C) (Oral)   Resp 17   Ht 5\' 8"  (1.727 m)   Wt (!) 163.8 kg   LMP 12/17/2021   SpO2 100%   BMI 54.91 kg/m  Physical Exam Vitals and nursing note reviewed.  Constitutional:      General: She is not in acute distress.    Appearance: She is well-developed. She is obese.  HENT:     Head:  Normocephalic and atraumatic.  Eyes:     Conjunctiva/sclera: Conjunctivae normal.  Cardiovascular:     Rate and Rhythm: Regular rhythm. Tachycardia present.     Heart sounds: No murmur heard.    Comments: Heart rate 110 on initial evaluation. Pulmonary:     Effort: Pulmonary effort is normal. No respiratory distress.     Breath sounds: Normal breath sounds.  Abdominal:     Palpations: Abdomen is soft.     Tenderness: There is no abdominal tenderness.  Musculoskeletal:        General: No swelling.     Cervical back: Neck supple.  Skin:    General: Skin is warm and dry.     Capillary Refill: Capillary refill takes less than 2 seconds.  Neurological:     Mental Status: She is alert and oriented to person, place, and time.     Cranial Nerves: No cranial nerve deficit.     Motor: No weakness.     Coordination: Coordination normal.     Comments: Patient reporting persistent numbness in the left upper and left lower extremity.  Psychiatric:        Mood and Affect: Mood normal.     ED Results / Procedures / Treatments   Labs (all labs ordered are listed, but only abnormal results are displayed) Labs Reviewed  BASIC METABOLIC PANEL  CBC  I-STAT BETA HCG BLOOD, ED ([place], [facility], [facility] ONLY)  TROPONIN I (HIGH SENSITIVITY)    EKG EKG Interpretation  Date/Time:  Saturday January 27 2022 18:51:45 EST Ventricular Rate:  104 PR Interval:  140 QRS Duration: 92 QT Interval:  323 QTC Calculation: 425 R Axis:   55 Text Interpretation: Sinus tachycardia Borderline T wave abnormalities No previous tracing Confirmed by Blanchie Dessert [phone]) on 01/27/2022 6:55:18 PM  Radiology No results found.  Procedures Procedures  {Document cardiac monitor, telemetry assessment procedure when appropriate:1}  Medications Ordered in ED Medications - No data to display  ED Course/ Medical Decision Making/ A&P                           Medical Decision Making Amount and/or Complexity of Data  Reviewed Labs: ordered. Radiology: ordered.   Patient's presentation is most consistent with acute presentation with potential threat to life or bodily function.   {Document critical care time when appropriate:1} {Document review of labs and clinical decision tools ie heart score, Chads2Vasc2 etc:1}  {Document your independent review of radiology images, and any outside records:1} {Document your discussion with family members, caretakers, and with consultants:1} {Document social determinants of health affecting pt's care:1} {Document your decision making why or why not admission, treatments were needed:1} Final Clinical Impression(s) / ED Diagnoses Final diagnoses:  None    Rx / DC Orders ED Discharge Orders     None

## 2022-01-27 NOTE — ED Provider Notes (Incomplete)
Patient is a 31 year old female with no significant medical history presenting today with sudden onset of left-sided tingling and numbness that started when she was sitting today.  She also felt like she was having a racing heart during this episode.  She had no chest pain, back pain or shortness of breath.  She reports her arm and leg feel normal now but her mouth still feels a little bit unusual.  She has no prior history of stroke, hypertension or diabetes.  Labs including hCG, BMP, CBC and troponin are all negative.  Head CT was negative.  However given her ongoing symptoms we will do an MRI to ensure no evidence of stroke or lesion concerning for MS.  She has no medic neck pain and given distribution included the face low suspicion for cervical cause.  Patient does not have chest pain and low suspicion that she has dissection as she is not hypertensive and is not complaining of any chest pain.  She is mildly tachycardic but has no shortness of breath and low suspicion for PE at this time.

## 2022-01-27 NOTE — ED Notes (Signed)
Pt transported to MRI 

## 2022-01-28 NOTE — ED Notes (Signed)
Patient verbalizes understanding of discharge instructions. Opportunity for questioning and answers were provided. Armband removed by staff, pt discharged from ED. Ambulated out to lobby with family ? ?

## 2022-01-30 ENCOUNTER — Ambulatory Visit: Payer: Medicaid Other | Admitting: Family Medicine

## 2022-01-30 VITALS — BP 122/68 | HR 94 | Ht 68.0 in | Wt 345.4 lb

## 2022-01-30 DIAGNOSIS — R200 Anesthesia of skin: Secondary | ICD-10-CM

## 2022-01-30 DIAGNOSIS — M5440 Lumbago with sciatica, unspecified side: Secondary | ICD-10-CM | POA: Diagnosis not present

## 2022-01-30 NOTE — Patient Instructions (Addendum)
For the back/leg pain you are having I would recommend calling your OBs office to see if they have any advice.  Something we could consider is pelvic floor rehab to see if that would do anything.  I do recommend during her next menstrual cycle trying out ibuprofen and seeing if that helps any of the pain.

## 2022-01-30 NOTE — Progress Notes (Signed)
    SUBJECTIVE:   CHIEF COMPLAINT / HPI:   Numbness - Left sided numbness on Saturday - Started with her feet and then went all the way up her body  - Felt "wobbly" when trying to walk  - Lasted for about 12 hours, she felt fine after the next morning - No recurrence of the symptoms since then  Back/leg Pain associated with menstrual cycles - Had tubes tied 5 years ago - Since last year has been having alternating shooting pain in her lower back that shoots down her leg that alternates every month  - no pain with intercourse - Denies urinary or stool incontinence  - Sometimes limits her walking   PERTINENT  PMH / PSH: Reviewed  OBJECTIVE:   BP 122/68   Pulse 94   Ht 5\' 8"  (1.727 m)   Wt (!) 345 lb 6.4 oz (156.7 kg)   LMP 01/18/2022   SpO2 100%   BMI 52.52 kg/m   General: NAD, well-appearing, well-nourished Respiratory: No respiratory distress, breathing comfortably, able to speak in full sentences Skin: warm and dry, no rashes noted on exposed skin Psych: Appropriate affect and mood  ASSESSMENT/PLAN:   Numbness Unclear etiology of numbness that led to patient being evaluated in the ER and was negative for CVA or other acute etiology.  Has not had a recurrence of the symptoms and do not feel that further workup is necessary at this time.  Patient in agreement - Return if recurring symptoms  Back/leg pain Associated with menstrual cycles/ovulation with alternating sides each month that are causing pain.  Pain description is abnormal as it mimics the sciatica but without the consistency of this.  Patient feels may be related to her tubes being tied.  Consideration that there could be some pelvic floor dysfunction that could be causing this pain versus this being atypical ovulatory pain. - Recommend patient talk with OB - Consider pelvic floor rehab if warranted - Can consider OCP management for pain - Trial of Tylenol/ibuprofen during next menstrual cycle to see if any  improvement  01/20/2022, DO [place] [facility]

## 2022-03-09 ENCOUNTER — Other Ambulatory Visit (HOSPITAL_COMMUNITY)
Admission: RE | Admit: 2022-03-09 | Discharge: 2022-03-09 | Disposition: A | Discharge status: Home / Self Care | Payer: Medicaid Other | Source: Ambulatory Visit | Attending: Family Medicine | Admitting: Family Medicine

## 2022-03-09 ENCOUNTER — Ambulatory Visit: Payer: Medicaid Other | Admitting: Student

## 2022-03-09 ENCOUNTER — Encounter: Payer: Self-pay | Admitting: Student

## 2022-03-09 VITALS — BP 146/82 | HR 99 | Ht 68.0 in | Wt 343.8 lb

## 2022-03-09 DIAGNOSIS — Z3200 Encounter for pregnancy test, result unknown: Secondary | ICD-10-CM | POA: Diagnosis not present

## 2022-03-09 DIAGNOSIS — N926 Irregular menstruation, unspecified: Secondary | ICD-10-CM

## 2022-03-09 DIAGNOSIS — Z113 Encounter for screening for infections with a predominantly sexual mode of transmission: Secondary | ICD-10-CM | POA: Insufficient documentation

## 2022-03-09 LAB — POCT URINE PREGNANCY: Preg Test, Ur: NEGATIVE

## 2022-03-09 LAB — POCT WET PREP (WET MOUNT)
Clue Cells Wet Prep Whiff POC: POSITIVE
Trichomonas Wet Prep HPF POC: ABSENT
WBC, Wet Prep HPF POC: NONE SEEN

## 2022-03-09 MED ORDER — MEDROXYPROGESTERONE ACETATE 10 MG PO TABS: 10.0000 mg | ORAL_TABLET | Freq: Every day | ORAL | 0 refills | Status: DC

## 2022-03-09 NOTE — Patient Instructions (Addendum)
It was great to see you today! Thank you for choosing [facility] for your primary care. Carly Hopkins was seen for follow up.  Today we addressed: We will continue with provera to try to start your period. You will take a pill once a day for ten total days, then you should start to have a period after COMPLETING the medication Try to start on day 16 or 21 of your menstrual cycle while tracking  Call me if you do not start a period in the next couple of weeks following finishing the provera   If you haven't already, sign up for My Chart to have easy access to your labs results, and communication with your primary care physician.  I recommend that you always bring your medications to each appointment as this makes it easy to ensure you are on the correct medications and helps [place] not miss refills when you need them. Call the clinic at [phone] if your symptoms worsen or you have any concerns.  You should return to our clinic Return in about 4 weeks (around 04/06/2022) for Missed menses . Please arrive 15 minutes before your appointment to ensure smooth check in process.  We appreciate your efforts in making this happen.  Thank you for allowing me to participate in your care, Erskine Emery, MD 03/09/2022, 9:14 AM PGY-2, [place]

## 2022-03-09 NOTE — Progress Notes (Signed)
SUBJECTIVE:   CHIEF COMPLAINT / HPI:   Irregular Menstrual Cycle: -LMP November 2023, has not had a period since  -Sexually active with 1 partner in the last 6 months  - Medications tried: None  - Contraception: Tubal ligation, November 2018  Symptoms include: Cramping in the abdomen and back, when she is supposed to start and during ovulation.  Last pap smear NILM 07/22/2019 CBC 01/2022 nml -Never had this before  -No other symptoms, slight vaginal discharge last month that dissipated  -She has taken 3 home Upreg that were negative   PERTINENT  PMH / PSH: Anemia, HPV, H/o CINIII, obesity    OBJECTIVE:  BP (!) 146/82   Pulse 99   Ht 5\' 8"  (1.727 m)   Wt (!) 343 lb 12.8 oz (155.9 kg)   LMP 01/18/2022   SpO2 99%   BMI 52.27 kg/m  Physical Exam Vitals reviewed. Exam conducted with a chaperone present Lavell Anchors as chaperone).  Constitutional:      Appearance: Normal appearance. She is obese.  HENT:     Head: Normocephalic.     Nose: Nose normal.     Mouth/Throat:     Mouth: Mucous membranes are moist.  Eyes:     Conjunctiva/sclera: Conjunctivae normal.  Cardiovascular:     Rate and Rhythm: Normal rate and regular rhythm.  Pulmonary:     Effort: Pulmonary effort is normal.     Breath sounds: Normal breath sounds.  Genitourinary:    General: Normal vulva.     Pubic Area: No rash.      Labia:        Right: No rash or lesion.        Left: No rash or lesion.      Urethra: No urethral lesion.     Vagina: Normal. No vaginal discharge.     Cervix: Normal.  Musculoskeletal:     Cervical back: Normal range of motion.  Skin:    Capillary Refill: Capillary refill takes less than 2 seconds.  Neurological:     General: No focal deficit present.     Mental Status: She is alert.  Psychiatric:        Mood and Affect: Mood normal.        Behavior: Behavior normal.      ASSESSMENT/PLAN:  Missed period Assessment & Plan: Anovulatory cycle that is abnormal for the  patient.  No noticeable abnormalities on GU examination.  Will continue with Provera withdrawal to try to induce a menstrual cycle.  If we are unable to achieve this, we will continue with further workup to assess for secondary causes of missed menstrual cycle.  If this occurs again after inducing period, will also need further workup.  Orders: -     medroxyPROGESTERone Acetate; Take 1 tablet (10 mg total) by mouth daily for 10 days. Start on day 16 or 21 of the menstrual cycle  Dispense: 10 tablet; Refill: 0  Possible pregnancy Assessment & Plan: Upreg Negative   Orders: -     POCT urine pregnancy  Screen for STD (sexually transmitted disease) Assessment & Plan: Reviewed labs and allergies, will check GC/Chlamydia, Trichomonas, RPR, HIV, and HepC wet prep and will call patient with results. Pap UTD. Patient's questions answered to their satisfaction.   Orders: -     Cervicovaginal ancillary only -     POCT Wet Prep [facility]) -     HIV Antibody (routine testing w rflx) -     RPR -  Hepatitis C antibody    Return in about 4 weeks (around 04/06/2022) for Missed menses . Erskine Emery, MD 03/09/2022, 9:44 AM PGY-2, [place]

## 2022-03-09 NOTE — Assessment & Plan Note (Signed)
Reviewed labs and allergies, will check GC/Chlamydia, Trichomonas, RPR, HIV, and HepC wet prep and will call patient with results. Pap UTD. Patient's questions answered to their satisfaction.

## 2022-03-09 NOTE — Assessment & Plan Note (Signed)
Upreg Negative

## 2022-03-09 NOTE — Assessment & Plan Note (Signed)
Anovulatory cycle that is abnormal for the patient.  No noticeable abnormalities on GU examination.  Will continue with Provera withdrawal to try to induce a menstrual cycle.  If we are unable to achieve this, we will continue with further workup to assess for secondary causes of missed menstrual cycle.  If this occurs again after inducing period, will also need further workup.

## 2022-03-10 LAB — RPR: RPR Ser Ql: NONREACTIVE

## 2022-03-10 LAB — HEPATITIS C ANTIBODY: Hep C Virus Ab: NONREACTIVE

## 2022-03-10 LAB — HIV ANTIBODY (ROUTINE TESTING W REFLEX): HIV Screen 4th Generation wRfx: NONREACTIVE

## 2022-03-12 LAB — CERVICOVAGINAL ANCILLARY ONLY
Chlamydia: NEGATIVE
Comment: NEGATIVE
Comment: NEGATIVE
Comment: NORMAL
Neisseria Gonorrhea: NEGATIVE
Trichomonas: NEGATIVE

## 2022-03-13 ENCOUNTER — Ambulatory Visit: Payer: Medicaid Other

## 2022-03-13 VITALS — BP 138/82 | HR 88

## 2022-03-13 DIAGNOSIS — R030 Elevated blood-pressure reading, without diagnosis of hypertension: Secondary | ICD-10-CM

## 2022-03-13 NOTE — Progress Notes (Signed)
Patient here today for BP check.      Last BP was on 03/09/2022 and was 146/82.  BP today is 138/82 with a pulse of 88.    Checked BP in left arm with large cuff.    Symptoms present: None.   Patient is not currently prescribed BP meds. Patient has a FU apt scheduled with PCP for 04/04/2022.  Routed note to PCP.

## 2022-04-04 ENCOUNTER — Ambulatory Visit: Payer: Medicaid Other | Admitting: Student

## 2022-04-04 ENCOUNTER — Encounter: Payer: Self-pay | Admitting: Student

## 2022-04-04 VITALS — BP 130/70 | HR 78 | Temp 99.1°F | Resp 16 | Wt 346.4 lb

## 2022-04-04 DIAGNOSIS — N911 Secondary amenorrhea: Secondary | ICD-10-CM

## 2022-04-04 NOTE — Progress Notes (Signed)
Patient is here for follow-up irregular periods. Per patient ,her cycle has not yet stared and is very concern.

## 2022-04-04 NOTE — Patient Instructions (Addendum)
It was great to see you today! Thank you for choosing [facility] for your primary care. Carly Hopkins was seen for follow up.  Today we addressed: We are grabbing some labs, I will update you with the results   If you haven't already, sign up for My Chart to have easy access to your labs results, and communication with your primary care physician.  I recommend that you always bring your medications to each appointment as this makes it easy to ensure you are on the correct medications and helps [place] not miss refills when you need them. Call the clinic at [phone] if your symptoms worsen or you have any concerns.  You should return to our clinic Return in about 4 weeks (around 05/02/2022). Please arrive 15 minutes before your appointment to ensure smooth check in process.  We appreciate your efforts in making this happen.  Thank you for allowing me to participate in your care, Erskine Emery, MD 04/04/2022, 10:01 AM PGY-2, [place]

## 2022-04-04 NOTE — Assessment & Plan Note (Signed)
Given the fact that Provera did not induce a breakthrough bleed, will continue with lab work for testing of secondary amenorrhea.  Continuing with FSH, estradiol, prolactin, and TSH.  Patient does not seem to have any hyper androgenic effects, making testosterone secreting tumor less likely.  Will have her follow-up in 4 weeks or earlier depending on lab work results.

## 2022-04-04 NOTE — Progress Notes (Signed)
  SUBJECTIVE:   CHIEF COMPLAINT / HPI:   Follow Up Missed Menses:  Wet prep last + BV >did not treat as asymptomatic  Provera given to induce menses  No STIs on last check  Patient reports that she took 10 days of Provera but did not have breakthrough bleeding, has not had a period since November 2023. Patient denies any abnormal hair growth or acne.   BP Follow Up: WNL on nursing check 138/82. Today also within normal limits.   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   Hypertension    Resolved - only with pregnancy   Morbid obesity ([place]) 01/08/2017   Severe dysplasia of cervix (CIN III)    After HGSIL pap   SVD (spontaneous vaginal delivery) 2011, 11/2016   x 2    OBJECTIVE:  BP 130/70   Pulse 78   Temp 99.1 F (37.3 C) (Oral)   Resp 16   Wt (!) 346 lb 6.4 oz (157.1 kg)   LMP 01/18/2022   BMI 52.67 kg/m  Physical Exam  General: Alert and oriented in no apparent distress HEENT: thyroid unremarkable without nodules  Heart: Regular rate and rhythm with no murmurs appreciated Lungs: CTA bilaterally, no wheezing Abdomen: Bowel sounds present, no abdominal pain Skin: Warm and dry   ASSESSMENT/PLAN:  Secondary amenorrhea Assessment & Plan: Given the fact that Provera did not induce a breakthrough bleed, will continue with lab work for testing of secondary amenorrhea.  Continuing with FSH, estradiol, prolactin, and TSH.  Patient does not seem to have any hyper androgenic effects, making testosterone secreting tumor less likely.  Will have her follow-up in 4 weeks or earlier depending on lab work results.  Orders: -     TSH Rfx on Abnormal to Free T4 -     Prolactin -     Estradiol -     Follicle stimulating hormone   Return in about 4 weeks (around 05/02/2022). Erskine Emery, MD 04/04/2022, 10:25 AM PGY-2, [place]

## 2022-04-05 LAB — PROLACTIN: Prolactin: 9.3 ng/mL (ref 4.8–33.4)

## 2022-04-05 LAB — TSH RFX ON ABNORMAL TO FREE T4: TSH: 1.3 u[IU]/mL (ref 0.450–4.500)

## 2022-04-05 LAB — FOLLICLE STIMULATING HORMONE: FSH: 6.8 m[IU]/mL

## 2022-04-05 LAB — ESTRADIOL: Estradiol: 50.9 pg/mL

## 2022-04-09 ENCOUNTER — Other Ambulatory Visit: Payer: Self-pay | Admitting: Student

## 2022-04-09 DIAGNOSIS — N911 Secondary amenorrhea: Secondary | ICD-10-CM

## 2023-03-21 ENCOUNTER — Other Ambulatory Visit (HOSPITAL_COMMUNITY)
Admission: RE | Admit: 2023-03-21 | Discharge: 2023-03-21 | Disposition: A | Discharge status: Home / Self Care | Payer: Medicaid Other | Source: Ambulatory Visit | Attending: Family Medicine | Admitting: Family Medicine

## 2023-03-21 ENCOUNTER — Ambulatory Visit: Payer: Medicaid Other | Admitting: Family Medicine

## 2023-03-21 ENCOUNTER — Encounter: Payer: Self-pay | Admitting: Family Medicine

## 2023-03-21 VITALS — BP 124/93 | HR 95 | Ht 68.0 in | Wt 345.4 lb

## 2023-03-21 DIAGNOSIS — Z23 Encounter for immunization: Secondary | ICD-10-CM

## 2023-03-21 DIAGNOSIS — Z113 Encounter for screening for infections with a predominantly sexual mode of transmission: Secondary | ICD-10-CM

## 2023-03-21 DIAGNOSIS — Z124 Encounter for screening for malignant neoplasm of cervix: Secondary | ICD-10-CM | POA: Insufficient documentation

## 2023-03-21 DIAGNOSIS — Z0000 Encounter for general adult medical examination without abnormal findings: Secondary | ICD-10-CM | POA: Diagnosis not present

## 2023-03-21 NOTE — Progress Notes (Signed)
    SUBJECTIVE:   CHIEF COMPLAINT / HPI:   Carly Hopkins is a 33 yo woman presenting today for yearly well woman exam. Due for influenza and Tdap-initially declined but on reevaluation elected to have shot done today.  We do not have Tdap in the office at this time so she will have to either come back for a nurse visit or go to the ED pharmacy. Due for Pap for cervical cancer screening will complete today, along with STI screening per the patient's request  No other health maintenance concerns at this time.  Patient did inquire about eye exams, as she has noticed that her vision is not as good as it used to be.  No red flag signs such as sudden vision loss or double vision.  Advised patient that her concerns would be best taken care of at an optometrist office as they would be able to complete her exam and provide prescription lenses as needed.  PERTINENT  PMH / PSH: CIN-3  OBJECTIVE:   BP (!) 124/93   Pulse 95   Ht 5\' 8"  (1.727 m)   Wt (!) 345 lb 6.4 oz (156.7 kg)   LMP 03/03/2023   SpO2 100%   BMI 52.52 kg/m   General: A&O, NAD Cardiac: RRR, no m/r/g Respiratory: CTAB, normal WOB, no w/c/r GU: Cleatrice Burke present as chaperone.  Normal external female genitalia, vaginal mucosa is moist, pink and without defect.  Normal-appearing cervix, without discharge or blood apparent.  ASSESSMENT/PLAN:   Screen for STD (sexually transmitted disease) GC chlamydia and trichomonas will be tested with Pap smear.  HIV and RPR blood test collected today.  Cervical cancer screening Pap smear with reflex HPV collected.  Given patient's history of CIN-3 will be sure to follow-up these results closely.  Advised patient that I will call her with results if anything is abnormal.  Health maintenance examination Provided influenza shot today.  Addressed other care maintenance concerns as appropriate, including desire for eye exam.  Advised patient that she should go to an optometrist for examination and  creation of any needed prescription lenses.   Gerrit Heck, DO [facility]

## 2023-03-21 NOTE — Assessment & Plan Note (Signed)
Pap smear with reflex HPV collected.  Given patient's history of CIN-3 will be sure to follow-up these results closely.  Advised patient that I will call her with results if anything is abnormal.

## 2023-03-21 NOTE — Patient Instructions (Addendum)
It was wonderful to see you today!  Today was your yearly exam. We performed your pap smear, and also collected samples to test for common sexually transmitted infections. When the results of these tests are available, you will receive a message through my chart with your results if there are no concerns. If there are any concerning results, you will get a phone call so we can discuss next steps.   Please call [phone] with any questions about today's appointment.   If you need any additional refills, please call your pharmacy before calling the office.  Gerrit Heck, DO Family Medicine

## 2023-03-21 NOTE — Assessment & Plan Note (Signed)
GC chlamydia and trichomonas will be tested with Pap smear.  HIV and RPR blood test collected today.

## 2023-03-21 NOTE — Assessment & Plan Note (Signed)
Provided influenza shot today.  Addressed other care maintenance concerns as appropriate, including desire for eye exam.  Advised patient that she should go to an optometrist for examination and creation of any needed prescription lenses.

## 2023-03-22 ENCOUNTER — Encounter: Payer: Self-pay | Admitting: Family Medicine

## 2023-03-22 LAB — TREPONEMAL ANTIBODIES, TPPA: Treponemal Antibodies, TPPA: NONREACTIVE

## 2023-03-22 LAB — RPR W/REFLEX TO TREPSURE: RPR: NONREACTIVE

## 2023-03-22 LAB — HIV ANTIBODY (ROUTINE TESTING W REFLEX): HIV Screen 4th Generation wRfx: NONREACTIVE

## 2023-03-26 LAB — CYTOLOGY - PAP
Adequacy: ABSENT
Chlamydia: NEGATIVE
Comment: NEGATIVE
Comment: NEGATIVE
Comment: NEGATIVE
Comment: NORMAL
Diagnosis: NEGATIVE
High risk HPV: NEGATIVE
Neisseria Gonorrhea: NEGATIVE
Trichomonas: NEGATIVE

## 2023-08-06 ENCOUNTER — Encounter: Payer: Self-pay | Admitting: *Deleted
